# Patient Record
Sex: Female | Born: 1985 | Race: White | Hispanic: No | Marital: Married | Smoking: Never smoker
Health system: Southern US, Community
[De-identification: ages and names within clinical notes are randomized; demographics above are authoritative.]

## PROBLEM LIST (undated history)

## (undated) ENCOUNTER — Inpatient Hospital Stay (HOSPITAL_COMMUNITY): Payer: Self-pay

## (undated) DIAGNOSIS — M51369 Other intervertebral disc degeneration, lumbar region without mention of lumbar back pain or lower extremity pain: Secondary | ICD-10-CM

## (undated) DIAGNOSIS — R519 Headache, unspecified: Secondary | ICD-10-CM

## (undated) DIAGNOSIS — M5136 Other intervertebral disc degeneration, lumbar region: Secondary | ICD-10-CM

## (undated) DIAGNOSIS — R51 Headache: Secondary | ICD-10-CM

## (undated) DIAGNOSIS — T8859XA Other complications of anesthesia, initial encounter: Secondary | ICD-10-CM

## (undated) DIAGNOSIS — J45909 Unspecified asthma, uncomplicated: Secondary | ICD-10-CM

## (undated) DIAGNOSIS — T4145XA Adverse effect of unspecified anesthetic, initial encounter: Secondary | ICD-10-CM

## (undated) HISTORY — PX: BACK SURGERY: SHX140

## (undated) HISTORY — PX: SPINAL FUSION: SHX223

## (undated) HISTORY — PX: CHOLECYSTECTOMY: SHX55

## (undated) HISTORY — PX: BREAST LUMPECTOMY: SHX2

---
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
## 2004-06-12 ENCOUNTER — Ambulatory Visit: Payer: Self-pay | Admitting: Orthopedic Surgery

## 2004-06-25 ENCOUNTER — Emergency Department: Payer: Self-pay | Admitting: Emergency Medicine

## 2005-04-16 ENCOUNTER — Emergency Department: Payer: Self-pay | Admitting: Unknown Physician Specialty

## 2005-09-17 ENCOUNTER — Ambulatory Visit: Payer: Self-pay | Admitting: Unknown Physician Specialty

## 2005-10-29 ENCOUNTER — Ambulatory Visit: Payer: Self-pay | Admitting: Vascular Surgery

## 2006-01-07 ENCOUNTER — Emergency Department: Payer: Self-pay | Admitting: Emergency Medicine

## 2008-04-18 ENCOUNTER — Ambulatory Visit: Payer: Self-pay | Admitting: Orthopedic Surgery

## 2011-11-30 ENCOUNTER — Encounter (HOSPITAL_COMMUNITY): Payer: Self-pay | Admitting: *Deleted

## 2011-11-30 ENCOUNTER — Emergency Department (HOSPITAL_COMMUNITY)
Admission: EM | Admit: 2011-11-30 | Discharge: 2011-11-30 | Disposition: A | Discharge status: Home / Self Care | Payer: BC Managed Care – PPO | Source: Home / Self Care

## 2011-11-30 DIAGNOSIS — K5289 Other specified noninfective gastroenteritis and colitis: Secondary | ICD-10-CM

## 2011-11-30 DIAGNOSIS — K529 Noninfective gastroenteritis and colitis, unspecified: Secondary | ICD-10-CM

## 2011-11-30 MED ORDER — ONDANSETRON 4 MG PO TBDP
4.0000 mg | ORAL_TABLET | Freq: Once | ORAL | Status: AC
  Administered 2011-11-30: 4 mg via ORAL

## 2011-11-30 MED ORDER — ONDANSETRON HCL 4 MG PO TABS: 4.0000 mg | ORAL_TABLET | Freq: Four times a day (QID) | ORAL | Status: AC

## 2011-11-30 NOTE — ED Provider Notes (Signed)
History     CSN: [id number]  Arrival date & time 11/30/11  1541   None     Chief Complaint  Patient presents with  . Diarrhea  . Emesis    (Consider location/radiation/quality/duration/timing/severity/associated sxs/prior treatment) HPI Comments: Pt developed diarrhea 11/27/11, averaging an episode an hour.  Assoc with crampy abd pain.  Has been using imodium with no relief.  Did not alter diet and has been eating chicken sandwiches, ham, etc.  Vomiting started this morning.  None in 1.5 hours prior to this exam.    Patient is a 27 y.o. female presenting with diarrhea and vomiting. The history is provided by the patient.  Diarrhea The primary symptoms include abdominal pain, nausea, vomiting and diarrhea. Primary symptoms do not include fever, melena or hematemesis. The illness began 3 to 5 days ago. The onset was sudden. The problem has not changed since onset. The abdominal pain began more than 2 days ago. The abdominal pain has been unchanged since its onset. The abdominal pain is generalized. The severity of the abdominal pain is 4/10.  The illness does not include chills.  Emesis  This is a new problem. The current episode started 6 to 12 hours ago. The problem occurs 5 to 10 times per day. The problem has been gradually improving. The emesis has an appearance of stomach contents. There has been no fever. Associated symptoms include abdominal pain and diarrhea. Pertinent negatives include no chills, no cough and no fever.    History reviewed. No pertinent past medical history.  Past Surgical History  Procedure Date  . Spinal fusion     No family history on file.  History  Substance Use Topics  . Smoking status: Never Smoker   . Smokeless tobacco: Not on file  . Alcohol Use: Yes     Occasional    OB History    Grav Para Term Preterm Abortions TAB SAB Ect Mult Living                  Review of Systems  Constitutional: Positive for appetite change. Negative for  fever and chills.  Respiratory: Negative for cough.   Gastrointestinal: Positive for nausea, vomiting, abdominal pain and diarrhea. Negative for blood in stool, melena and hematemesis.    Allergies  Bentyl  Home Medications   Current Outpatient Rx  Name Route Sig Dispense Refill  . LOPERAMIDE HCL 2 MG PO CAPS Oral Take 2 mg by mouth 4 (four) times daily as needed.    . NORETHINDRONE 0.35 MG PO TABS Oral Take 1 tablet by mouth daily.    Marland Kitchen ONDANSETRON HCL 4 MG PO TABS Oral Take 1 tablet (4 mg total) by mouth every 6 (six) hours. 12 tablet 0    BP 110/72  Pulse 110  Temp(Src) 98.8 F (37.1 C) (Oral)  Resp 16  SpO2 99%  Physical Exam  Constitutional: She appears well-developed and well-nourished. No distress.  Cardiovascular: Normal rate and regular rhythm.   Pulmonary/Chest: Effort normal and breath sounds normal.  Abdominal: Soft. She exhibits no distension. Bowel sounds are increased. There is generalized tenderness. There is no rigidity, no rebound and no guarding.       Mild generalized abd pain on palpation.   Skin: She is not diaphoretic.    ED Course  Procedures (including critical care time)  Labs Reviewed - No data to display No results found.   1. Gastroenteritis       MDM  Tolerated oral fluids well  here in [facility], no vomiting here. Discussed diet strategies at length.        Cathlyn Parsons, NP 11/30/11 1754

## 2011-11-30 NOTE — Discharge Instructions (Signed)
Clear liquids ONLY for 24 hours after the last time you vomited.  Then, you can slowly add back solid foods, starting with bland, simple foods like saltines, plain toast/bread, plain white rice, plain pasta, etc.  If you tolerate that for another day, gradually add back your regular foods, leaving meat, dairy, spicy and greasy foods for last.    Clear Liquid Diet The clear liquid dietconsists of foods that are liquid or will become liquid at room temperature.You should be able to see through the liquid and beverages. Examples of foods allowed on a clear liquid diet include fruit juice, broth or bouillon, gelatin, or frozen ice pops. The purpose of this diet is to provide necessary fluid, electrolytes such as sodium and potassium, and energy to keep the body functioning during times when you are not able to consume a regular diet.A clear liquid diet should not be continued for long periods of time as it is not nutritionally adequate.  REASONS FOR USING A CLEAR LIQUID DIET  In sudden onset (acute) conditions for a patient before or after surgery.   As the first step in oral feeding.   For fluid and electrolyte replacement in diarrheal diseases.   As a diet before certain medical tests are performed.  ADEQUACY The clear liquid diet is adequate only in ascorbic acid, according to the Recommended Dietary Allowances of the [employer]. CHOOSING FOODS Breads and Starches  Allowed:  None are allowed.   Avoid: All are avoided.  Vegetables  Allowed:  Strained tomato or vegetable juice.   Avoid: Any others.  Fruit  Allowed:  Strained fruit juices and fruit drinks. Include 1 serving of citrus or vitamin C-enriched fruit juice daily.   Avoid: Any others.  Meat and Meat Substitutes  Allowed:  None are allowed.   Avoid: All are avoided.  Milk  Allowed:  None are allowed.   Avoid: All are avoided.  Soups and Combination Foods  Allowed:  Clear bouillon, broth, or strained  broth-based soups.   Avoid: Any others.  Desserts and Sweets  Allowed:  Sugar, honey. High protein gelatin. Flavored gelatin, ices, or frozen ice pops that do not contain milk.   Avoid: Any others.  Fats and Oils  Allowed:  None are allowed.   Avoid: All are avoided.  Beverages  Allowed: Cereal beverages, coffee (regular or decaffeinated), tea, or soda at the discretion of your caregiver.   Avoid: Any others.  Condiments  Allowed:  Iodized salt.   Avoid: Any others, including pepper.  Supplements  Allowed:  Liquid nutrition beverages.   Avoid: Any others that contain lactose or fiber.  SAMPLE MEAL PLAN Breakfast  4 oz (120 mL) strained orange juice.    to 1 cup (125 to 250 mL) gelatin (plain or fortified).   1 cup (250 mL) beverage (coffee or tea).   Sugar, if desired.  Midmorning Snack   cup (125 mL) gelatin (plain or fortified).  Lunch  1 cup (250 mL) broth or consomm.   4 oz (120 mL) strained grapefruit juice.    cup (125 mL) gelatin (plain or fortified).   1 cup (250 mL) beverage (coffee or tea).   Sugar, if desired.  Midafternoon Snack   cup (125 mL) fruit ice.    cup (125 mL) strained fruit juice.  Dinner  1 cup (250 mL) broth or consomm.    cup (125 mL) cranberry juice.    cup (125 mL) flavored gelatin (plain or fortified).   1 cup (  250 mL) beverage (coffee or tea).   Sugar, if desired.  Evening Snack  4 oz (120 mL) strained apple juice (vitamin C-fortified).    cup (125 mL) flavored gelatin (plain or fortified).  Document Released: 08/18/2005 Document Revised: 08/07/2011 Document Reviewed: 11/15/2010 [facility] Patient Information 2012 [place], [place].B.R.A.T. Diet Your doctor has recommended the B.R.A.T. diet for you or your child until the condition improves. This is often used to help control diarrhea and vomiting symptoms. If you or your child can tolerate clear liquids, you may have:  Bananas.   Rice.   Applesauce.     Toast (and other simple starches such as crackers, potatoes, noodles).  Be sure to avoid dairy products, meats, and fatty foods until symptoms are better. Fruit juices such as apple, grape, and prune juice can make diarrhea worse. Avoid these. Continue this diet for 2 days or as instructed by your caregiver. Document Released: 08/18/2005 Document Revised: 08/07/2011 Document Reviewed: 02/04/2007 [facility] Patient Information 2012 [place], [place].Viral Gastroenteritis Viral gastroenteritis is also known as stomach flu. This condition affects the stomach and intestinal tract. It can cause sudden diarrhea and vomiting. The illness typically lasts 3 to 8 days. Most people develop an immune response that eventually gets rid of the virus. While this natural response develops, the virus can make you quite ill. CAUSES  Many different viruses can cause gastroenteritis, such as rotavirus or noroviruses. You can catch one of these viruses by consuming contaminated food or water. You may also catch a virus by sharing utensils or other personal items with an infected person or by touching a contaminated surface. SYMPTOMS  The most common symptoms are diarrhea and vomiting. These problems can cause a severe loss of body fluids (dehydration) and a body salt (electrolyte) imbalance. Other symptoms may include:  Fever.   Headache.   Fatigue.   Abdominal pain.  DIAGNOSIS  Your caregiver can usually diagnose viral gastroenteritis based on your symptoms and a physical exam. A stool sample may also be taken to test for the presence of viruses or other infections. TREATMENT  This illness typically goes away on its own. Treatments are aimed at rehydration. The most serious cases of viral gastroenteritis involve vomiting so severely that you are not able to keep fluids down. In these cases, fluids must be given through an intravenous line (IV). HOME CARE INSTRUCTIONS   Drink enough fluids to keep your urine clear  or pale yellow. Drink small amounts of fluids frequently and increase the amounts as tolerated.   Ask your caregiver for specific rehydration instructions.   Avoid:   Foods high in sugar.   Alcohol.   Carbonated drinks.   Tobacco.   Juice.   Caffeine drinks.   Extremely hot or cold fluids.   Fatty, greasy foods.   Too much intake of anything at one time.   Dairy products until 24 to 48 hours after diarrhea stops.   You may consume probiotics. Probiotics are active cultures of beneficial bacteria. They may lessen the amount and number of diarrheal stools in adults. Probiotics can be found in yogurt with active cultures and in supplements.   Wash your hands well to avoid spreading the virus.   Only take over-the-counter or prescription medicines for pain, discomfort, or fever as directed by your caregiver. Do not give aspirin to children. Antidiarrheal medicines are not recommended.   Ask your caregiver if you should continue to take your regular prescribed and over-the-counter medicines.   Keep all follow-up appointments as  directed by your caregiver.  SEEK IMMEDIATE MEDICAL CARE IF:   You are unable to keep fluids down.   You do not urinate at least once every 6 to 8 hours.   You develop shortness of breath.   You notice blood in your stool or vomit. This may look like coffee grounds.   You have abdominal pain that increases or is concentrated in one small area (localized).   You have persistent vomiting or diarrhea.   You have a fever.   The patient is a child younger than 3 months, and he or she has a fever.   The patient is a child older than 3 months, and he or she has a fever and persistent symptoms.   The patient is a child older than 3 months, and he or she has a fever and symptoms suddenly get worse.   The patient is a baby, and he or she has no tears when crying.  MAKE SURE YOU:   Understand these instructions.   Will watch your condition.    Will get help right away if you are not doing well or get worse.  Document Released: 08/18/2005 Document Revised: 08/07/2011 Document Reviewed: 06/04/2011 [facility] Patient Information 2012 [place], [place].

## 2011-11-30 NOTE — ED Provider Notes (Signed)
Medical screening examination/treatment/procedure(s) were performed by non-physician practitioner and as supervising physician I was immediately available for consultation/collaboration.  Alaine Loughney   Wilbert Schouten, MD 11/30/11 1956 

## 2011-11-30 NOTE — ED Notes (Signed)
C/O watery diarrhea and generalized abd cramping onset Thurs without fevers.  Today starting vomiting.  Has been able to keep down small amounts of PO fluids.  Has been taking Pepto, Tums, and Imodium (states she has maxed out on the Imodium for the last 2 days).  Pt is currently breastfeeding.

## 2011-11-30 NOTE — ED Notes (Signed)
Sprite offered - instructed on small, frequent sips.  Pt verbalized understanding.

## 2013-10-06 ENCOUNTER — Encounter (HOSPITAL_COMMUNITY): Payer: Self-pay | Admitting: Emergency Medicine

## 2013-10-06 ENCOUNTER — Emergency Department (INDEPENDENT_AMBULATORY_CARE_PROVIDER_SITE_OTHER): Payer: 59

## 2013-10-06 ENCOUNTER — Emergency Department (HOSPITAL_COMMUNITY)
Admission: EM | Admit: 2013-10-06 | Discharge: 2013-10-06 | Disposition: A | Discharge status: Home / Self Care | Payer: 59 | Source: Home / Self Care | Attending: Emergency Medicine | Admitting: Emergency Medicine

## 2013-10-06 DIAGNOSIS — J40 Bronchitis, not specified as acute or chronic: Secondary | ICD-10-CM

## 2013-10-06 MED ORDER — SODIUM CHLORIDE 0.9 % IV BOLUS (SEPSIS)
1000.0000 mL | Freq: Once | INTRAVENOUS | Status: AC
  Administered 2013-10-06: 1000 mL via INTRAVENOUS

## 2013-10-06 MED ORDER — AZITHROMYCIN 250 MG PO TABS: ORAL_TABLET | ORAL | Status: DC

## 2013-10-06 MED ORDER — IPRATROPIUM-ALBUTEROL 0.5-2.5 (3) MG/3ML IN SOLN
3.0000 mL | Freq: Once | RESPIRATORY_TRACT | Status: AC
  Administered 2013-10-06: 3 mL via RESPIRATORY_TRACT

## 2013-10-06 MED ORDER — IPRATROPIUM-ALBUTEROL 0.5-2.5 (3) MG/3ML IN SOLN: 3.0000 mL | Freq: Once | RESPIRATORY_TRACT | Status: DC

## 2013-10-06 MED ORDER — HYDROCOD POLST-CHLORPHEN POLST 10-8 MG/5ML PO LQCR: 5.0000 mL | Freq: Two times a day (BID) | ORAL | Status: DC | PRN

## 2013-10-06 MED ORDER — PREDNISONE (PAK) 5 MG PO TABS: ORAL_TABLET | ORAL | Status: DC

## 2013-10-06 MED ORDER — IPRATROPIUM-ALBUTEROL 0.5-2.5 (3) MG/3ML IN SOLN
RESPIRATORY_TRACT | Status: AC
  Filled 2013-10-06: qty 3

## 2013-10-06 MED ORDER — DEXAMETHASONE SODIUM PHOSPHATE 10 MG/ML IJ SOLN
INTRAMUSCULAR | Status: AC
  Filled 2013-10-06: qty 1

## 2013-10-06 MED ORDER — DEXAMETHASONE SODIUM PHOSPHATE 10 MG/ML IJ SOLN
10.0000 mg | Freq: Once | INTRAMUSCULAR | Status: AC
  Administered 2013-10-06: 10 mg via INTRAMUSCULAR

## 2013-10-06 NOTE — ED Notes (Signed)
Pt     Reports     Symptoms      Of  bronchitis         With           Cough   And congestion  Body  Aches      Her  Symptoms  Began about 1  Week  Ago  She  Was  Seen  [facility]               2  Days  Ago                She  Is  Masked and is in a  Private  Room

## 2013-10-06 NOTE — Discharge Instructions (Signed)

## 2013-10-06 NOTE — ED Provider Notes (Signed)
CSN: [id number]     Arrival date & time 10/06/13  0811 History   None    Chief Complaint  Patient presents with  . URI   (Consider location/radiation/quality/duration/timing/severity/associated sxs/prior Treatment) HPI Comments: [age] presents presents complaining of being sick for one week. For one week, she has a persistent cough, congestion, runny nose. In the last couple of days, she started to also have some shortness of breath and chest pain with coughing. The pain is in the band around her lower ribs. She has been seen at the [facility] in the clinic twice for this. Was recently, she was prescribed numerous cough suppressants and an inhaler. This does seem to help slightly, but overall her symptoms are still getting markedly worse. When this began, she had a fever of 101 but this has since resolved. She also admits to some pressure in her ears and sore throat from coughing. She denies NVD, recent travel. She does have sick contacts.   History reviewed. No pertinent past medical history. Past Surgical History  Procedure Laterality Date  . Spinal fusion     History reviewed. No pertinent family history. History  Substance Use Topics  . Smoking status: Never Smoker   . Smokeless tobacco: Not on file  . Alcohol Use: Yes     Comment: Occasional   OB History   Grav Para Term Preterm Abortions TAB SAB Ect Mult Living                 Review of Systems  Constitutional: Positive for fever and fatigue. Negative for chills.  HENT: Positive for congestion, ear pain, postnasal drip, rhinorrhea, sinus pressure and sore throat.   Eyes: Negative for visual disturbance.  Respiratory: Positive for cough, chest tightness and shortness of breath.   Cardiovascular: Negative for chest pain, palpitations and leg swelling.  Gastrointestinal: Negative for nausea, vomiting and abdominal pain.  Endocrine: Negative for polydipsia and polyuria.  Genitourinary: Negative for dysuria, urgency and frequency.   Musculoskeletal: Positive for myalgias. Negative for arthralgias.  Skin: Negative for rash.  Neurological: Negative for dizziness, weakness and light-headedness.    Allergies  Dicyclomine hcl  Home Medications   Current Outpatient Rx  Name  Route  Sig  Dispense  Refill  . albuterol (PROVENTIL HFA;VENTOLIN HFA) 108 (90 BASE) MCG/ACT inhaler   Inhalation   Inhale into the lungs every 6 (six) hours as needed for wheezing or shortness of breath.         . BENZONATATE PO   Oral   Take by mouth.         . fluticasone (FLONASE) 50 MCG/ACT nasal spray   Each Nare   Place into both nostrils daily.         Marland Kitchen. guaiFENesin-codeine (GUIATUSS AC) 100-10 MG/5ML syrup   Oral   Take 5 mLs by mouth 3 (three) times daily as needed for cough.         Marland Kitchen. azithromycin (ZITHROMAX Z-PAK) 250 MG tablet      Use as directed   6 each   0   . chlorpheniramine-HYDROcodone (TUSSIONEX PENNKINETIC ER) 10-8 MG/5ML LQCR   Oral   Take 5 mLs by mouth every 12 (twelve) hours as needed for cough.   115 mL   0   . loperamide (IMODIUM) 2 MG capsule   Oral   Take 2 mg by mouth 4 (four) times daily as needed.         . norethindrone (MICRONOR,CAMILA,ERRIN) 0.35 MG tablet  Oral   Take 1 tablet by mouth daily.         . predniSONE (STERAPRED UNI-PAK) 5 MG TABS tablet      Use as directed on package   1 each   0    BP 120/74  Pulse 78  Temp(Src) 98.6 F (37 C) (Oral)  Resp 20  SpO2 98%  LMP 10/02/2013 Physical Exam  Nursing note and vitals reviewed. Constitutional: She is oriented to person, place, and time. Vital signs are normal. She appears well-developed and well-nourished. She appears ill. No distress.  HENT:  Head: Normocephalic and atraumatic.  Right Ear: Tympanic membrane, external ear and ear canal normal.  Left Ear: Tympanic membrane, external ear and ear canal normal.  Nose: Nose normal. Right sinus exhibits no maxillary sinus tenderness and no frontal sinus  tenderness. Left sinus exhibits no maxillary sinus tenderness and no frontal sinus tenderness.  Mouth/Throat: Uvula is midline, oropharynx is clear and moist and mucous membranes are normal.  Cardiovascular: Regular rhythm, normal heart sounds and normal pulses.  Tachycardia present.  Exam reveals no gallop.   No murmur heard. HR 108 manual   Pulmonary/Chest: Effort normal. No respiratory distress. She has wheezes (scattered). She has no rhonchi. She has no rales. She exhibits no mass.  Abdominal: Soft. There is no tenderness.  Lymphadenopathy:       Head (right side): Submandibular and tonsillar adenopathy present.       Head (left side): Submandibular and tonsillar adenopathy present.  Neurological: She is alert and oriented to person, place, and time. She has normal strength. Coordination normal.  Skin: Skin is warm and dry. No rash noted. She is not diaphoretic.  Psychiatric: She has a normal mood and affect. Judgment normal.    ED Course  Procedures (including critical care time) Labs Review Labs Reviewed - No data to display Imaging Review Dg Chest 2 View  10/06/2013   CLINICAL DATA:  Sick for 1 week, cough, head and chest congestion, fever, shortness of breath, wheezing, dizziness  EXAM: CHEST  2 VIEW  COMPARISON:  None  FINDINGS: Normal heart size, mediastinal contours, and pulmonary vascularity.  Peribronchial thickening without infiltrate, pleural effusion or pneumothorax.  Bones unremarkable.  IMPRESSION: Mild bronchitic changes.   Electronically Signed   By: Ulyses Southward M.D.   On: 10/06/2013 08:58    930: Pt became slightly dizzy during XR.  Given duoneb upon returning and allowed to sit for a few minutes.  Following this, orthostatics revealed significant increase in pulse without drop in BP, will give 1 L bolus 0.9% NaCl and reassess.    1054: after 1 L, she is feeling slightly better. Pulse is 85 with blood pressure 120/80, oxygen saturation is 100%. She is requesting  discharge to home and rest  MDM   1. Bronchitis    Will discharge with the caveat that she return to the emergency department if worsening. Given her worsening course over one week, will treat with antibiotics, steroids, cough suppressant, she will continue Ventolin    Meds ordered this encounter  Medications  . ipratropium-albuterol (DUONEB) 0.5-2.5 (3) MG/3ML nebulizer solution 3 mL    Sig:   . dexamethasone (DECADRON) injection 10 mg    Sig:   . sodium chloride 0.9 % bolus 1,000 mL    Sig:   . azithromycin (ZITHROMAX Z-PAK) 250 MG tablet    Sig: Use as directed    Dispense:  6 each    Refill:  0  .  predniSONE (STERAPRED UNI-PAK) 5 MG TABS tablet    Sig: Use as directed on package    Dispense:  1 each    Refill:  0  . chlorpheniramine-HYDROcodone (TUSSIONEX PENNKINETIC ER) 10-8 MG/5ML LQCR    Sig: Take 5 mLs by mouth every 12 (twelve) hours as needed for cough.    Dispense:  115 mL    Refill:  0      Graylon Good, PA-C 10/06/13 1059

## 2013-10-06 NOTE — ED Notes (Signed)
bp   118/81     Laying  Pulse   90  bp  116/81     Sitting Pulse  113   bp  118/86       Standing    123

## 2013-10-06 NOTE — ED Provider Notes (Signed)
Medical screening examination/treatment/procedure(s) were performed by non-physician practitioner and as supervising physician I was immediately available for consultation/collaboration.  Tanmay Halteman, M.D.   Raylee Adamec C Rhonda Linan, MD 10/06/13 1904 

## 2014-08-16 ENCOUNTER — Encounter (HOSPITAL_COMMUNITY): Payer: Self-pay | Admitting: Emergency Medicine

## 2014-08-16 ENCOUNTER — Emergency Department (HOSPITAL_COMMUNITY)
Admission: EM | Admit: 2014-08-16 | Discharge: 2014-08-17 | Disposition: A | Discharge status: Home / Self Care | Payer: 59 | Attending: Emergency Medicine | Admitting: Emergency Medicine

## 2014-08-16 DIAGNOSIS — Z79899 Other long term (current) drug therapy: Secondary | ICD-10-CM | POA: Insufficient documentation

## 2014-08-16 DIAGNOSIS — R110 Nausea: Secondary | ICD-10-CM | POA: Insufficient documentation

## 2014-08-16 DIAGNOSIS — Z3202 Encounter for pregnancy test, result negative: Secondary | ICD-10-CM | POA: Insufficient documentation

## 2014-08-16 DIAGNOSIS — R109 Unspecified abdominal pain: Secondary | ICD-10-CM

## 2014-08-16 DIAGNOSIS — R1010 Upper abdominal pain, unspecified: Secondary | ICD-10-CM | POA: Diagnosis not present

## 2014-08-16 DIAGNOSIS — Z7951 Long term (current) use of inhaled steroids: Secondary | ICD-10-CM | POA: Insufficient documentation

## 2014-08-16 DIAGNOSIS — Z9049 Acquired absence of other specified parts of digestive tract: Secondary | ICD-10-CM | POA: Diagnosis not present

## 2014-08-16 NOTE — ED Notes (Signed)
Pt. reports right flank/right lateral abdominal pain onset this evening with nausea , denies hematuria or dysuria , no fever or chills.

## 2014-08-17 ENCOUNTER — Emergency Department (HOSPITAL_COMMUNITY): Payer: 59

## 2014-08-17 LAB — COMPREHENSIVE METABOLIC PANEL
ALBUMIN: 4 g/dL (ref 3.5–5.2)
ALK PHOS: 122 U/L — AB (ref 39–117)
ALT: 34 U/L (ref 0–35)
ANION GAP: 14 (ref 5–15)
AST: 28 U/L (ref 0–37)
BILIRUBIN TOTAL: 0.4 mg/dL (ref 0.3–1.2)
BUN: 13 mg/dL (ref 6–23)
CHLORIDE: 103 meq/L (ref 96–112)
CO2: 24 mEq/L (ref 19–32)
CREATININE: 0.77 mg/dL (ref 0.50–1.10)
Calcium: 9.2 mg/dL (ref 8.4–10.5)
GLUCOSE: 107 mg/dL — AB (ref 70–99)
POTASSIUM: 4 meq/L (ref 3.7–5.3)
Sodium: 141 mEq/L (ref 137–147)
Total Protein: 7.7 g/dL (ref 6.0–8.3)

## 2014-08-17 LAB — URINALYSIS, ROUTINE W REFLEX MICROSCOPIC
BILIRUBIN URINE: NEGATIVE
Glucose, UA: NEGATIVE mg/dL
HGB URINE DIPSTICK: NEGATIVE
KETONES UR: NEGATIVE mg/dL
NITRITE: NEGATIVE
PROTEIN: NEGATIVE mg/dL
Specific Gravity, Urine: 1.025 (ref 1.005–1.030)
UROBILINOGEN UA: 0.2 mg/dL (ref 0.0–1.0)
pH: 6 (ref 5.0–8.0)

## 2014-08-17 LAB — CBC WITH DIFFERENTIAL/PLATELET
BASOS PCT: 1 % (ref 0–1)
Basophils Absolute: 0.1 10*3/uL (ref 0.0–0.1)
Eosinophils Absolute: 0.3 10*3/uL (ref 0.0–0.7)
Eosinophils Relative: 3 % (ref 0–5)
HEMATOCRIT: 39 % (ref 36.0–46.0)
HEMOGLOBIN: 13.6 g/dL (ref 12.0–15.0)
LYMPHS ABS: 3.1 10*3/uL (ref 0.7–4.0)
Lymphocytes Relative: 37 % (ref 12–46)
MCH: 31 pg (ref 26.0–34.0)
MCHC: 34.9 g/dL (ref 30.0–36.0)
MCV: 88.8 fL (ref 78.0–100.0)
MONO ABS: 0.7 10*3/uL (ref 0.1–1.0)
MONOS PCT: 9 % (ref 3–12)
NEUTROS ABS: 4.3 10*3/uL (ref 1.7–7.7)
Neutrophils Relative %: 50 % (ref 43–77)
Platelets: 279 10*3/uL (ref 150–400)
RBC: 4.39 MIL/uL (ref 3.87–5.11)
RDW: 12.2 % (ref 11.5–15.5)
WBC: 8.5 10*3/uL (ref 4.0–10.5)

## 2014-08-17 LAB — PREGNANCY, URINE: PREG TEST UR: NEGATIVE

## 2014-08-17 LAB — URINE MICROSCOPIC-ADD ON

## 2014-08-17 MED ORDER — HYDROCODONE-ACETAMINOPHEN 5-325 MG PO TABS: 1.0000 | ORAL_TABLET | ORAL | Status: DC | PRN

## 2014-08-17 MED ORDER — ONDANSETRON HCL 4 MG/2ML IJ SOLN
4.0000 mg | Freq: Once | INTRAMUSCULAR | Status: AC
Start: 2014-08-17 — End: 2014-08-17
  Administered 2014-08-17: 4 mg via INTRAVENOUS
  Filled 2014-08-17: qty 2

## 2014-08-17 MED ORDER — POLYETHYLENE GLYCOL 3350 17 G PO PACK: 17.0000 g | PACK | Freq: Every day | ORAL | Status: DC

## 2014-08-17 MED ORDER — ONDANSETRON HCL 4 MG PO TABS: 4.0000 mg | ORAL_TABLET | Freq: Once | ORAL | Status: DC

## 2014-08-17 MED ORDER — HYDROMORPHONE HCL 1 MG/ML IJ SOLN
0.5000 mg | Freq: Once | INTRAMUSCULAR | Status: AC
  Administered 2014-08-17: 0.5 mg via INTRAVENOUS
  Filled 2014-08-17: qty 1

## 2014-08-17 NOTE — ED Notes (Signed)
Pt escorted to bathroom 

## 2014-08-17 NOTE — ED Notes (Signed)
EDP at bedside  

## 2014-08-17 NOTE — ED Provider Notes (Signed)
CSN: [id number]     Arrival date & time 08/16/14  2333 History   First MD Initiated Contact with Patient 08/16/14 2340     Chief Complaint  Patient presents with  . Flank Pain     (Consider location/radiation/quality/duration/timing/severity/associated sxs/prior Treatment) Patient is a 28 y.o. female presenting with flank pain. The history is provided by the patient. No language interpreter was used.  Flank Pain This is a new problem. Pertinent negatives include no chills, fever or nausea. Associated symptoms comments: She had sudden onset right lateral upper abdominal wall pain earlier this evening. No SOB but pain is increased with deep breathing. No fever or cough. She has nausea without vomiting. She reports cholecystectomy 1.5 years ago. Marland Kitchen    History reviewed. No pertinent past medical history. Past Surgical History  Procedure Laterality Date  . Spinal fusion    . Cholecystectomy    . Breast lumpectomy     No family history on file. History  Substance Use Topics  . Smoking status: Never Smoker   . Smokeless tobacco: Not on file  . Alcohol Use: Yes     Comment: Occasional   OB History    No data available     Review of Systems  Constitutional: Negative for fever and chills.  Respiratory: Negative.        See HPI.  Cardiovascular: Negative.   Gastrointestinal: Negative for nausea.       See HPI.  Genitourinary: Positive for flank pain. Negative for dysuria and hematuria.  Musculoskeletal: Negative.   Skin: Negative.   Neurological: Negative.       Allergies  Dicyclomine hcl  Home Medications   Prior to Admission medications   Medication Sig Start Date End Date Taking? Authorizing Provider  albuterol (PROVENTIL HFA;VENTOLIN HFA) 108 (90 BASE) MCG/ACT inhaler Inhale into the lungs every 6 (six) hours as needed for wheezing or shortness of breath.    Historical Provider, MD  azithromycin (ZITHROMAX Z-PAK) 250 MG tablet Use as directed 10/06/13   Graylon Good, PA-C  BENZONATATE PO Take by mouth.    Historical Provider, MD  chlorpheniramine-HYDROcodone (TUSSIONEX PENNKINETIC ER) 10-8 MG/5ML LQCR Take 5 mLs by mouth every 12 (twelve) hours as needed for cough. 10/06/13   Graylon Good, PA-C  fluticasone (FLONASE) 50 MCG/ACT nasal spray Place into both nostrils daily.    Historical Provider, MD  guaiFENesin-codeine (GUIATUSS AC) 100-10 MG/5ML syrup Take 5 mLs by mouth 3 (three) times daily as needed for cough.    Historical Provider, MD  loperamide (IMODIUM) 2 MG capsule Take 2 mg by mouth 4 (four) times daily as needed.    Historical Provider, MD  norethindrone (MICRONOR,CAMILA,ERRIN) 0.35 MG tablet Take 1 tablet by mouth daily.    Historical Provider, MD  predniSONE (STERAPRED UNI-PAK) 5 MG TABS tablet Use as directed on package 10/06/13   Graylon Good, PA-C   BP 124/78 mmHg  Pulse 92  Temp(Src) 97.5 F (36.4 C) (Oral)  Resp 16  SpO2 100%  LMP  Physical Exam  Constitutional: She is oriented to person, place, and time. She appears well-developed and well-nourished.  HENT:  Head: Normocephalic.  Neck: Normal range of motion. Neck supple.  Cardiovascular: Normal rate and regular rhythm.   Pulmonary/Chest: Effort normal and breath sounds normal.  Abdominal: Soft. Bowel sounds are normal. There is no tenderness. There is no rebound and no guarding.    No RUQ tenderness. Tenderness is lateral upper abdominal area.   Genitourinary:  No CVA tenderness.   Musculoskeletal: Normal range of motion.  Neurological: She is alert and oriented to person, place, and time.  Skin: Skin is warm and dry. No rash noted.  Psychiatric: She has a normal mood and affect.    ED Course  Procedures (including critical care time) Labs Review Labs Reviewed  CBC WITH DIFFERENTIAL  COMPREHENSIVE METABOLIC PANEL  PREGNANCY, URINE  URINALYSIS, ROUTINE W REFLEX MICROSCOPIC   Results for orders placed or performed during the hospital encounter of 08/16/14    CBC with Differential  Result Value Ref Range   WBC 8.5 4.0 - 10.5 K/uL   RBC 4.39 3.87 - 5.11 MIL/uL   Hemoglobin 13.6 12.0 - 15.0 g/dL   HCT 16.139.0 09.636.0 - 04.546.0 %   MCV 88.8 78.0 - 100.0 fL   MCH 31.0 26.0 - 34.0 pg   MCHC 34.9 30.0 - 36.0 g/dL   RDW 40.912.2 81.111.5 - 91.415.5 %   Platelets 279 150 - 400 K/uL   Neutrophils Relative % 50 43 - 77 %   Neutro Abs 4.3 1.7 - 7.7 K/uL   Lymphocytes Relative 37 12 - 46 %   Lymphs Abs 3.1 0.7 - 4.0 K/uL   Monocytes Relative 9 3 - 12 %   Monocytes Absolute 0.7 0.1 - 1.0 K/uL   Eosinophils Relative 3 0 - 5 %   Eosinophils Absolute 0.3 0.0 - 0.7 K/uL   Basophils Relative 1 0 - 1 %   Basophils Absolute 0.1 0.0 - 0.1 K/uL  Comprehensive metabolic panel  Result Value Ref Range   Sodium 141 137 - 147 mEq/L   Potassium 4.0 3.7 - 5.3 mEq/L   Chloride 103 96 - 112 mEq/L   CO2 24 19 - 32 mEq/L   Glucose, Bld 107 (H) 70 - 99 mg/dL   BUN 13 6 - 23 mg/dL   Creatinine, Ser 7.820.77 0.50 - 1.10 mg/dL   Calcium 9.2 8.4 - 95.610.5 mg/dL   Total Protein 7.7 6.0 - 8.3 g/dL   Albumin 4.0 3.5 - 5.2 g/dL   AST 28 0 - 37 U/L   ALT 34 0 - 35 U/L   Alkaline Phosphatase 122 (H) 39 - 117 U/L   Total Bilirubin 0.4 0.3 - 1.2 mg/dL   GFR calc non Af Amer >90 >90 mL/min   GFR calc Af Amer >90 >90 mL/min   Anion gap 14 5 - 15  Pregnancy, urine  Result Value Ref Range   Preg Test, Ur NEGATIVE NEGATIVE  Urinalysis, Routine w reflex microscopic  Result Value Ref Range   Color, Urine YELLOW YELLOW   APPearance CLEAR CLEAR   Specific Gravity, Urine 1.025 1.005 - 1.030   pH 6.0 5.0 - 8.0   Glucose, UA NEGATIVE NEGATIVE mg/dL   Hgb urine dipstick NEGATIVE NEGATIVE   Bilirubin Urine NEGATIVE NEGATIVE   Ketones, ur NEGATIVE NEGATIVE mg/dL   Protein, ur NEGATIVE NEGATIVE mg/dL   Urobilinogen, UA 0.2 0.0 - 1.0 mg/dL   Nitrite NEGATIVE NEGATIVE   Leukocytes, UA TRACE (A) NEGATIVE  Urine microscopic-add on  Result Value Ref Range   Squamous Epithelial / LPF RARE RARE   WBC, UA  3-6 <3 WBC/hpf   RBC / HPF 0-2 <3 RBC/hpf   Bacteria, UA RARE RARE   Sperm, UA PRESENT    Urine-Other MUCOUS PRESENT    Dg Abd Acute W/chest  08/17/2014   CLINICAL DATA:  Initial evaluation for right-sided flank pain with nausea.  EXAM: ACUTE ABDOMEN  SERIES (ABDOMEN 2 VIEW & CHEST 1 VIEW)  COMPARISON:  Prior study from 10/06/2013  FINDINGS: Cardiac and mediastinal silhouettes are within normal limits.  Lungs are normally inflated. No focal infiltrate, pulmonary edema, or pleural effusion. No pneumothorax.  Bowel gas pattern is nonobstructive. No abnormal bowel wall thickening. Moderate to large amount of retained stool within the right colon. No free air. No soft tissue mass or abnormal calcification.  Spinal fusion present at the lumbosacral junction. No acute osseus abnormality.  IMPRESSION: 1. Nonobstructive bowel gas pattern. 2. Moderate to large amount of retained stool within the ascending colon, suggesting constipation. 3. No acute cardiopulmonary abnormality.   Electronically Signed   By: Rise MuBenjamin  McClintock M.D.   On: 08/17/2014 01:31    Imaging Review No results found.   EKG Interpretation None      MDM   Final diagnoses:  Flank pain    The patient is well appearing with reassuring labs/imaging. Constipation seen on abdominal film - recommended Miralax. Question correlation with pain of complaint. She is stable for discharge with return precautions discussed.     Arnoldo HookerShari A Steele Ledonne, PA-C 08/17/14 [id number]  Derwood KaplanAnkit Nanavati, MD 08/17/14 [phone]

## 2014-08-17 NOTE — Discharge Instructions (Signed)
Flank Pain °Flank pain refers to pain that is located on the side of the body between the upper abdomen and the back. The pain may occur over a short period of time (acute) or may be long-term or reoccurring (chronic). It may be mild or severe. Flank pain can be caused by many things. °CAUSES  °Some of the more common causes of flank pain include: °· Muscle strains.   °· Muscle spasms.   °· A disease of your spine (vertebral disk disease).   °· A lung infection (pneumonia).   °· Fluid around your lungs (pulmonary edema).   °· A kidney infection.   °· Kidney stones.   °· A very painful skin rash caused by the chickenpox virus (shingles).   °· Gallbladder disease.   °HOME CARE INSTRUCTIONS  °Home care will depend on the cause of your pain. In general, °· Rest as directed by your caregiver. °· Drink enough fluids to keep your urine clear or pale yellow. °· Only take over-the-counter or prescription medicines as directed by your caregiver. Some medicines may help relieve the pain. °· Tell your caregiver about any changes in your pain. °· Follow up with your caregiver as directed. °SEEK IMMEDIATE MEDICAL CARE IF:  °· Your pain is not controlled with medicine.   °· You have new or worsening symptoms. °· Your pain increases.   °· You have abdominal pain.   °· You have shortness of breath.   °· You have persistent nausea or vomiting.   °· You have swelling in your abdomen.   °· You feel faint or pass out.   °· You have blood in your urine. °· You have a fever or persistent symptoms for more than 2-3 days. °· You have a fever and your symptoms suddenly get worse. °MAKE SURE YOU:  °· Understand these instructions. °· Will watch your condition. °· Will get help right away if you are not doing well or get worse. °Document Released: 10/09/2005 Document Revised: 05/12/2012 Document Reviewed: 04/01/2012 °[employer]® Patient Information ©2015 [employer]. This information is not intended to replace advice given to you by your  health care provider. Make sure you discuss any questions you have with your health care provider. ° °

## 2014-08-24 ENCOUNTER — Other Ambulatory Visit: Payer: Self-pay | Admitting: Obstetrics & Gynecology

## 2014-08-24 ENCOUNTER — Other Ambulatory Visit (HOSPITAL_COMMUNITY)
Admission: RE | Admit: 2014-08-24 | Discharge: 2014-08-24 | Disposition: A | Discharge status: Home / Self Care | Payer: 59 | Source: Ambulatory Visit | Attending: Obstetrics & Gynecology | Admitting: Obstetrics & Gynecology

## 2014-08-24 DIAGNOSIS — Z01419 Encounter for gynecological examination (general) (routine) without abnormal findings: Secondary | ICD-10-CM | POA: Diagnosis not present

## 2014-08-29 LAB — CYTOLOGY - PAP

## 2015-04-25 ENCOUNTER — Emergency Department (HOSPITAL_COMMUNITY): Payer: 59

## 2015-04-25 ENCOUNTER — Emergency Department (HOSPITAL_COMMUNITY)
Admission: EM | Admit: 2015-04-25 | Discharge: 2015-04-25 | Disposition: A | Discharge status: Home / Self Care | Payer: 59 | Attending: Emergency Medicine | Admitting: Emergency Medicine

## 2015-04-25 ENCOUNTER — Encounter (HOSPITAL_COMMUNITY): Payer: Self-pay | Admitting: Emergency Medicine

## 2015-04-25 DIAGNOSIS — Y9389 Activity, other specified: Secondary | ICD-10-CM | POA: Insufficient documentation

## 2015-04-25 DIAGNOSIS — Z79899 Other long term (current) drug therapy: Secondary | ICD-10-CM | POA: Diagnosis not present

## 2015-04-25 DIAGNOSIS — S61211A Laceration without foreign body of left index finger without damage to nail, initial encounter: Secondary | ICD-10-CM | POA: Diagnosis not present

## 2015-04-25 DIAGNOSIS — IMO0002 Reserved for concepts with insufficient information to code with codable children: Secondary | ICD-10-CM

## 2015-04-25 DIAGNOSIS — Y9289 Other specified places as the place of occurrence of the external cause: Secondary | ICD-10-CM | POA: Diagnosis not present

## 2015-04-25 DIAGNOSIS — Y998 Other external cause status: Secondary | ICD-10-CM | POA: Diagnosis not present

## 2015-04-25 DIAGNOSIS — Y288XXA Contact with other sharp object, undetermined intent, initial encounter: Secondary | ICD-10-CM | POA: Diagnosis not present

## 2015-04-25 MED ORDER — HYDROCODONE-ACETAMINOPHEN 5-325 MG PO TABS
1.0000 | ORAL_TABLET | Freq: Once | ORAL | Status: AC
  Administered 2015-04-25: 1 via ORAL
  Filled 2015-04-25: qty 1

## 2015-04-25 MED ORDER — LIDOCAINE HCL (PF) 1 % IJ SOLN
5.0000 mL | Freq: Once | INTRAMUSCULAR | Status: AC
  Administered 2015-04-25: 5 mL
  Filled 2015-04-25: qty 5

## 2015-04-25 MED ORDER — ONDANSETRON 4 MG PO TBDP
4.0000 mg | ORAL_TABLET | Freq: Once | ORAL | Status: AC
  Administered 2015-04-25: 4 mg via ORAL
  Filled 2015-04-25: qty 1

## 2015-04-25 NOTE — ED Provider Notes (Signed)
CSN: [id number]     Arrival date & time 04/25/15  1927 History  This chart was scribed for non-physician practitioner, Everlene Farrier, PA-C working with Samuel Jester, DO by Gwenyth Ober, ED scribe. This patient was seen in room TR03C/TR03C and the patient's care was started at 8:45 PM   Chief Complaint  Patient presents with  . Laceration   The history is provided by the patient. No language interpreter was used.    HPI Comments: Maureen Nelson is a 29 y.o. right hand dominant female who presents to the Emergency Department complaining of a laceration, with controlled bleeding, to her left index finger that occurred PTA. She was cut by a hedge trimmer.  She states nausea as an associated symptom. Pt has not tried any treatment PTA. Her last tetanus was in 2012. Pt denies numbness and tingling as associated symptoms.   No past medical history on file. Past Surgical History  Procedure Laterality Date  . Spinal fusion    . Cholecystectomy    . Breast lumpectomy    . Back surgery     No family history on file. Social History  Substance Use Topics  . Smoking status: Never Smoker   . Smokeless tobacco: Not on file  . Alcohol Use: Yes     Comment: Occasional   OB History    No data available     Review of Systems  Constitutional: Negative for fever.  Gastrointestinal: Positive for nausea.  Skin: Positive for wound.  Neurological: Negative for weakness and numbness.      Allergies  Dicyclomine hcl  Home Medications   Prior to Admission medications   Medication Sig Start Date End Date Taking? Authorizing Provider  azithromycin (ZITHROMAX Z-PAK) 250 MG tablet Use as directed 10/06/13   Graylon Good, PA-C  chlorpheniramine-HYDROcodone (TUSSIONEX PENNKINETIC ER) 10-8 MG/5ML LQCR Take 5 mLs by mouth every 12 (twelve) hours as needed for cough. 10/06/13   Graylon Good, PA-C  HYDROcodone-acetaminophen (NORCO/VICODIN) 5-325 MG per tablet Take 1-2 tablets by mouth every 4  (four) hours as needed. 08/17/14   Elpidio Anis, PA-C  loperamide (IMODIUM) 2 MG capsule Take 2 mg by mouth 4 (four) times daily as needed.    Historical Provider, MD  norethindrone (MICRONOR,CAMILA,ERRIN) 0.35 MG tablet Take 1 tablet by mouth daily.    Historical Provider, MD  polyethylene glycol (MIRALAX) packet Take 17 g by mouth daily. 08/17/14   Elpidio Anis, PA-C  predniSONE (STERAPRED UNI-PAK) 5 MG TABS tablet Use as directed on package 10/06/13   Graylon Good, PA-C   BP 117/52 mmHg  Pulse 94  Temp(Src) 98.7 F (37.1 C)  Resp 19  Ht  (1.702 m)  Wt 215 lb (97.523 kg)  BMI 33.67 kg/m2  SpO2 99%  LMP 04/25/2015 Physical Exam  Constitutional: She appears well-developed and well-nourished. No distress.  Nontoxic appearing.  HENT:  Head: Normocephalic and atraumatic.  Eyes: Right eye exhibits no discharge. Left eye exhibits no discharge.  Cardiovascular: Normal rate and intact distal pulses.   Pulses:      Radial pulses are 2+ on the left side.  Pulmonary/Chest: Effort normal. No respiratory distress.  Musculoskeletal:  Left index finger: Good capillary refill. Full ROM. Sensation intact. Good strength all joints.  Neurological: She is alert. Coordination normal.  Skin: No rash noted. She is not diaphoretic.  1 cm irregular laceration to the volar aspect of her left index finger.   Psychiatric: She has a normal mood and  affect. Her behavior is normal.  Nursing note and vitals reviewed.   ED Course  LACERATION REPAIR Date/Time: 04/25/2015 9:30 PM Performed by: Everlene Farrier Authorized by: Everlene Farrier Consent: Verbal consent obtained. Risks and benefits: risks, benefits and alternatives were discussed Consent given by: patient Patient understanding: patient states understanding of the procedure being performed Patient consent: the patient's understanding of the procedure matches consent given Procedure consent: procedure consent matches procedure  scheduled Relevant documents: relevant documents present and verified Test results: test results available and properly labeled Site marked: the operative site was marked Imaging studies: imaging studies available Required items: required blood products, implants, devices, and special equipment available Patient identity confirmed: verbally with patient Time out: Immediately prior to procedure a "time out" was called to verify the correct patient, procedure, equipment, support staff and site/side marked as required. Body area: upper extremity Location details: left index finger Laceration length: 1 cm Foreign bodies: no foreign bodies Tendon involvement: none Nerve involvement: none Vascular damage: no Anesthesia: digital block Local anesthetic: lidocaine 1% without epinephrine Anesthetic total: 2 ml Patient sedated: no Preparation: Patient was prepped and draped in the usual sterile fashion. Irrigation solution: saline Irrigation method: jet lavage Amount of cleaning: extensive Debridement: none Degree of undermining: none Skin closure: 5-0 Prolene Number of sutures: 3 Technique: simple Approximation: loose Approximation difficulty: complex Dressing: non-adhesive packing strip Patient tolerance: Patient tolerated the procedure well with no immediate complications     DIAGNOSTIC STUDIES: Oxygen Saturation is 99% on RA, normal by my interpretation.    COORDINATION OF CARE: 8:51 PM Discussed treatment plan with pt which includes Zofran and laceration repair. Pt agreed to plan.   Labs Review Labs Reviewed - No data to display  Imaging Review Dg Finger Index Left  04/25/2015   CLINICAL DATA:  Laceration of the left index finger.  EXAM: LEFT INDEX FINGER 2+V  COMPARISON:  None.  FINDINGS: There is no evidence of fracture or dislocation. There is no evidence of arthropathy or other focal bone abnormality. Soft tissues are unremarkable.  IMPRESSION: Negative.   Electronically  Signed   By: Elige Ko   On: 04/25/2015 20:27     EKG Interpretation None      Filed Vitals:   04/25/15 1952  BP: 117/52  Pulse: 94  Temp: 98.7 F (37.1 C)  Resp: 19  Height: 5\' 7"  (1.702 m)  Weight: 215 lb (97.523 kg)  SpO2: 99%     MDM   Meds given in ED:  Medications  ondansetron (ZOFRAN-ODT) disintegrating tablet 4 mg (4 mg Oral Given 04/25/15 2054)  HYDROcodone-acetaminophen (NORCO/VICODIN) 5-325 MG per tablet 1 tablet (1 tablet Oral Given 04/25/15 2054)  lidocaine (PF) (XYLOCAINE) 1 % injection 5 mL (5 mLs Infiltration Given 04/25/15 2054)    New Prescriptions   No medications on file    Final diagnoses:  Laceration   This is a 29 year old right hand dominant female who presents to the emergency department complaining of a laceration to her left index finger. She reports she was cut by a Counsellor. Patient reports her last Tdap was in 2012. On exam the patient is afebrile nontoxic appearing. She has good sensation in her left distal fingertips. She has a 1 cm irregular laceration to the volar aspect of her left index finger. X-ray is unremarkable. No evidence of tendon or vascular involvement. Patient has good strength and sensation of her left distal fingertip. Bleeding is controlled. Laceration was repaired by me and tolerated well by  the patient. Three 5-0 proline sutures placed. Looser approximation due to partial superficial skin avulsion. Wound care instructions provided. I advised the patient follow up in 7 days to have her sutures removed. I advised the patient to follow-up with their primary care provider this week. I advised the patient to return to the emergency department with new or worsening symptoms or new concerns. The patient verbalized understanding and agreement with plan.    I personally performed the services described in this documentation, which was scribed in my presence. The recorded information has been reviewed and is accurate.       Everlene Farrier, PA-C 04/25/15 2148  Samuel Jester, DO 04/27/15 1357

## 2015-04-25 NOTE — Discharge Instructions (Signed)

## 2015-04-25 NOTE — ED Notes (Addendum)
Pt reports she was trimming hedges and it slipped and cut her left pointer finger. Small 3mm lac, bleeding controlled. [place] TDAP 2012.

## 2015-09-02 ENCOUNTER — Encounter (HOSPITAL_COMMUNITY): Payer: Self-pay | Admitting: *Deleted

## 2015-09-02 ENCOUNTER — Inpatient Hospital Stay (HOSPITAL_COMMUNITY)
Admission: EM | Admit: 2015-09-02 | Discharge: 2015-09-02 | Disposition: A | Discharge status: Home / Self Care | Payer: 59 | Source: Ambulatory Visit | Attending: Obstetrics & Gynecology | Admitting: Obstetrics & Gynecology

## 2015-09-02 DIAGNOSIS — Z3A01 Less than 8 weeks gestation of pregnancy: Secondary | ICD-10-CM | POA: Insufficient documentation

## 2015-09-02 DIAGNOSIS — R112 Nausea with vomiting, unspecified: Secondary | ICD-10-CM | POA: Insufficient documentation

## 2015-09-02 DIAGNOSIS — O219 Vomiting of pregnancy, unspecified: Secondary | ICD-10-CM

## 2015-09-02 DIAGNOSIS — O218 Other vomiting complicating pregnancy: Secondary | ICD-10-CM | POA: Diagnosis not present

## 2015-09-02 HISTORY — DX: Other complications of anesthesia, initial encounter: T88.59XA

## 2015-09-02 HISTORY — DX: Adverse effect of unspecified anesthetic, initial encounter: T41.45XA

## 2015-09-02 LAB — URINALYSIS, ROUTINE W REFLEX MICROSCOPIC
BILIRUBIN URINE: NEGATIVE
GLUCOSE, UA: NEGATIVE mg/dL
KETONES UR: 15 mg/dL — AB
NITRITE: NEGATIVE
PH: 6 (ref 5.0–8.0)
Protein, ur: NEGATIVE mg/dL
Specific Gravity, Urine: 1.025 (ref 1.005–1.030)

## 2015-09-02 LAB — URINE MICROSCOPIC-ADD ON

## 2015-09-02 LAB — POCT PREGNANCY, URINE: PREG TEST UR: POSITIVE — AB

## 2015-09-02 MED ORDER — METOCLOPRAMIDE HCL 10 MG PO TABS: 10.0000 mg | ORAL_TABLET | Freq: Three times a day (TID) | ORAL | Status: DC | PRN

## 2015-09-02 MED ORDER — PROMETHAZINE HCL 25 MG PO TABS
25.0000 mg | ORAL_TABLET | Freq: Once | ORAL | Status: AC
  Administered 2015-09-02: 25 mg via ORAL
  Filled 2015-09-02: qty 1

## 2015-09-02 NOTE — L&D Delivery Note (Signed)
Delivery Note At 1:37 PM a viable female was delivered via Vaginal, Spontaneous Delivery (Presentation: ROA  ).  APGAR: 9, 9; weight 7 lb 1.6 oz (3220 g).   Placenta status: L&D  Cord:  with the following complications: .  Cord pH: not collected  Anesthesia:  epidural Episiotomy: None Lacerations:  Vaginal wall bilaterally Suture Repair: 3.0 vicryl Est. Blood Loss (mL): 400  Mom to postpartum.  Baby to Couplet care / Skin to Skin.  Myna HidalgoZAN, Reis Pienta, M 04/06/2016, 4:16 PM

## 2015-09-02 NOTE — MAU Note (Signed)
Pos HPT last week, vomiting 5-6 times a day since Thursday, unable to hold liquids down.  Feeling weak & dizzy.  Has lost 10 lbs in 2 weeks.  Denies diarrhea, has upper abd pain from vomiting, no bleeding.

## 2015-09-02 NOTE — MAU Provider Note (Signed)
History     CSN: [id number]  Arrival date and time: 09/02/15 1846   First Provider Initiated Contact with Patient 09/02/15 1926      Chief Complaint  Patient presents with  . Emesis  . Dizziness   HPI Maureen Nelson 30 y.o. G2P1001 @7weeks  presents to MAU complaining of nausea and vomitting.  It has been worse over the last 4-5 days and she is vomitting 5-6 times per day.  She cannot keep anything down.  She vomits within 10-30 minutes after eating or drinking.  She is able to sleep but wakes up feeling nauseous.  It is all day long. With her last pregnancy she was only nauseous in the morning.  She just learned of pregnancy less than a week ago.  Her first prenatal appointment is 1/9.  There is no vaginal bleeding, dysuria, fever, vaginal discharge, abdominal pain.   OB History    Gravida Para Term Preterm AB TAB SAB Ectopic Multiple Living   2 1 1       1       Past Medical History  Diagnosis Date  . Complication of anesthesia     Difficulty "waking up"    Past Surgical History  Procedure Laterality Date  . Spinal fusion    . Cholecystectomy    . Breast lumpectomy    . Back surgery    . Breast lumpectomy      No family history on file.  Social History  Substance Use Topics  . Smoking status: Never Smoker   . Smokeless tobacco: None  . Alcohol Use: No     Comment: Occasional    Allergies:  Allergies  Allergen Reactions  . Dicyclomine Hcl Rash    Prescriptions prior to admission  Medication Sig Dispense Refill Last Dose  . Prenatal Vit-Fe Fumarate-FA (MULTIVITAMIN-PRENATAL) 27-0.8 MG TABS tablet Take 1 tablet by mouth daily.    09/02/2015 at Unknown time    ROS Pertinent ROS in HPI.  All other systems are negative.   Physical Exam   Blood pressure 115/76, pulse 105, temperature 98.2 F (36.8 C), temperature source Oral, resp. rate 16, height 5\' 6"  (1.676 m), weight 196 lb 3.2 oz (88.996 kg), last menstrual period 06/17/2015.  Physical Exam   Constitutional: She is oriented to person, place, and time. She appears well-developed and well-nourished. No distress.  HENT:  Head: Normocephalic and atraumatic.  Eyes: Conjunctivae and EOM are normal.  Neck: Normal range of motion. Neck supple.  Cardiovascular: Normal rate, regular rhythm and normal heart sounds.   Respiratory: Breath sounds normal. No respiratory distress.  GI: Soft. Bowel sounds are normal. She exhibits no distension. There is no tenderness. There is no rebound and no guarding.  Musculoskeletal: Normal range of motion. She exhibits no edema.  Neurological: She is alert and oriented to person, place, and time.  Skin: Skin is warm and dry.  Psychiatric: She has a normal mood and affect. Her behavior is normal.   Results for orders placed or performed during the hospital encounter of 09/02/15 (from the past 24 hour(s))  Urinalysis, Routine w reflex microscopic (not at [facility])     Status: Abnormal   Collection Time: 09/02/15  6:55 PM  Result Value Ref Range   Color, Urine YELLOW YELLOW   APPearance CLEAR CLEAR   Specific Gravity, Urine 1.025 1.005 - 1.030   pH 6.0 5.0 - 8.0   Glucose, UA NEGATIVE NEGATIVE mg/dL   Hgb urine dipstick MODERATE (A) NEGATIVE  Bilirubin Urine NEGATIVE NEGATIVE   Ketones, ur 15 (A) NEGATIVE mg/dL   Protein, ur NEGATIVE NEGATIVE mg/dL   Nitrite NEGATIVE NEGATIVE   Leukocytes, UA SMALL (A) NEGATIVE  Urine microscopic-add on     Status: Abnormal   Collection Time: 09/02/15  6:55 PM  Result Value Ref Range   Squamous Epithelial / LPF 0-5 (A) NONE SEEN   WBC, UA 0-5 0 - 5 WBC/hpf   RBC / HPF 0-5 0 - 5 RBC/hpf   Bacteria, UA FEW (A) NONE SEEN   Urine-Other MUCOUS PRESENT   Pregnancy, urine POC     Status: Abnormal   Collection Time: 09/02/15  7:21 PM  Result Value Ref Range   Preg Test, Ur POSITIVE (A) NEGATIVE    MAU Course  Procedures  MDM Pt prefers oral medication.  She is given po phenergan.   After 45 minutes, she has not  vomited and reports feeling improved.  She is able to keep down sprite and refuses to eat.  She is craving something particular and will wait for that.   Dr. Sallye OberKulwa consulted.  She is agreeable to patient discharge with reglan for nausea at home.  Push po fluids as able.  Return if no improvement.    Assessment and Plan  A:  1. Nausea/vomiting in pregnancy    P: Discharge to home Rx Reglan Push po fluids [facility] Patient may return to MAU as needed or if her condition were to change or worsen   Bertram DenverKaren E Teague Clark 09/02/2015, 7:26 PM

## 2015-09-02 NOTE — Discharge Instructions (Signed)

## 2015-09-02 NOTE — MAU Note (Addendum)
Pt reprts n/v which has been worse the last 2 days. Pt reports vomiting 5-6 times per day. Pt states that she has been on the same 32oz gatorade for 2 days. Pt denies bleeding or cramping.   Pt reports LMP to be October 16th and that she has "irregular periods" . Pt has an appointment scheduled for Jan 9th.

## 2015-09-05 LAB — CULTURE, OB URINE

## 2015-09-10 LAB — OB RESULTS CONSOLE ANTIBODY SCREEN: ANTIBODY SCREEN: NEGATIVE

## 2015-09-10 LAB — OB RESULTS CONSOLE GC/CHLAMYDIA
Chlamydia: NEGATIVE
GC PROBE AMP, GENITAL: NEGATIVE

## 2015-09-10 LAB — OB RESULTS CONSOLE RPR: RPR: NONREACTIVE

## 2015-09-10 LAB — OB RESULTS CONSOLE ABO/RH: RH Type: POSITIVE

## 2015-09-10 LAB — OB RESULTS CONSOLE HEPATITIS B SURFACE ANTIGEN: Hepatitis B Surface Ag: NEGATIVE

## 2015-09-10 LAB — OB RESULTS CONSOLE RUBELLA ANTIBODY, IGM: RUBELLA: IMMUNE

## 2015-09-10 LAB — OB RESULTS CONSOLE HIV ANTIBODY (ROUTINE TESTING): HIV: NONREACTIVE

## 2015-10-28 ENCOUNTER — Encounter (HOSPITAL_COMMUNITY): Payer: Self-pay

## 2015-10-28 ENCOUNTER — Inpatient Hospital Stay (HOSPITAL_COMMUNITY)
Admission: AD | Admit: 2015-10-28 | Discharge: 2015-10-28 | Disposition: A | Discharge status: Home / Self Care | Payer: 59 | Source: Ambulatory Visit | Attending: Obstetrics and Gynecology | Admitting: Obstetrics and Gynecology

## 2015-10-28 ENCOUNTER — Inpatient Hospital Stay (HOSPITAL_COMMUNITY): Payer: 59

## 2015-10-28 DIAGNOSIS — Z981 Arthrodesis status: Secondary | ICD-10-CM | POA: Diagnosis not present

## 2015-10-28 DIAGNOSIS — Z9889 Other specified postprocedural states: Secondary | ICD-10-CM | POA: Insufficient documentation

## 2015-10-28 DIAGNOSIS — M5136 Other intervertebral disc degeneration, lumbar region: Secondary | ICD-10-CM | POA: Diagnosis not present

## 2015-10-28 DIAGNOSIS — R1030 Lower abdominal pain, unspecified: Secondary | ICD-10-CM | POA: Diagnosis present

## 2015-10-28 DIAGNOSIS — R109 Unspecified abdominal pain: Secondary | ICD-10-CM | POA: Diagnosis present

## 2015-10-28 DIAGNOSIS — O26892 Other specified pregnancy related conditions, second trimester: Secondary | ICD-10-CM | POA: Diagnosis not present

## 2015-10-28 DIAGNOSIS — Z3A16 16 weeks gestation of pregnancy: Secondary | ICD-10-CM | POA: Insufficient documentation

## 2015-10-28 DIAGNOSIS — O26899 Other specified pregnancy related conditions, unspecified trimester: Secondary | ICD-10-CM

## 2015-10-28 DIAGNOSIS — R102 Pelvic and perineal pain: Secondary | ICD-10-CM

## 2015-10-28 HISTORY — DX: Other intervertebral disc degeneration, lumbar region without mention of lumbar back pain or lower extremity pain: M51.369

## 2015-10-28 HISTORY — DX: Other intervertebral disc degeneration, lumbar region: M51.36

## 2015-10-28 LAB — URINALYSIS, ROUTINE W REFLEX MICROSCOPIC
BILIRUBIN URINE: NEGATIVE
GLUCOSE, UA: NEGATIVE mg/dL
HGB URINE DIPSTICK: NEGATIVE
Ketones, ur: NEGATIVE mg/dL
Nitrite: NEGATIVE
PROTEIN: NEGATIVE mg/dL
Specific Gravity, Urine: <1.005 — ABNORMAL LOW (ref 1.005–1.030)
pH: 6 (ref 5.0–8.0)

## 2015-10-28 LAB — URINE MICROSCOPIC-ADD ON

## 2015-10-28 MED ORDER — CYCLOBENZAPRINE HCL 5 MG PO TABS
5.0000 mg | ORAL_TABLET | Freq: Once | ORAL | Status: AC
  Administered 2015-10-28: 5 mg via ORAL
  Filled 2015-10-28: qty 1

## 2015-10-28 MED ORDER — CYCLOBENZAPRINE HCL 10 MG PO TABS
10.0000 mg | ORAL_TABLET | Freq: Two times a day (BID) | ORAL | Status: DC | PRN
Start: 2015-10-28 — End: 2016-02-07

## 2015-10-28 NOTE — Discharge Instructions (Signed)
Round Ligament Pain  The round ligament is a cord of muscle and tissue that helps to support the uterus. It can become a source of pain during pregnancy if it becomes stretched or twisted as the baby grows. The pain usually begins in the second trimester of pregnancy, and it can come and go until the baby is delivered. It is not a serious problem, and it does not cause harm to the baby.  Round ligament pain is usually a short, sharp, and pinching pain, but it can also be a dull, lingering, and aching pain. The pain is felt in the lower side of the abdomen or in the groin. It usually starts deep in the groin and moves up to the outside of the hip area. Pain can occur with:   A sudden change in position.   Rolling over in bed.   Coughing or sneezing.   Physical activity.  HOME CARE INSTRUCTIONS  Watch your condition for any changes. Take these steps to help with your pain:   When the pain starts, relax. Then try:    Sitting down.    Flexing your knees up to your abdomen.    Lying on your side with one pillow under your abdomen and another pillow between your legs.    Sitting in a warm bath for 15-20 minutes or until the pain goes away.   Take over-the-counter and prescription medicines only as told by your health care provider.   Move slowly when you sit and stand.   Avoid long walks if they cause pain.   Stop or lessen your physical activities if they cause pain.  SEEK MEDICAL CARE IF:   Your pain does not go away with treatment.   You feel pain in your back that you did not have before.   Your medicine is not helping.  SEEK IMMEDIATE MEDICAL CARE IF:   You develop a fever or chills.   You develop uterine contractions.   You develop vaginal bleeding.   You develop nausea or vomiting.   You develop diarrhea.   You have pain when you urinate.     This information is not intended to replace advice given to you by your health care provider. Make sure you discuss any questions you have with your health  care provider.     Document Released: 05/27/2008 Document Revised: 11/10/2011 Document Reviewed: 10/25/2014  Elsevier Interactive Patient Education 2016 [employer]

## 2015-10-28 NOTE — MAU Note (Addendum)
Onset of groin and vaginal pain 3 days ago today worse radiating down into thighs, denies vaginal bleeding, denies dysuria, no constipation was seen in MD office on Friday and everything checked out okay.

## 2015-10-28 NOTE — MAU Provider Note (Signed)
History     CSN: [id number]  Arrival date and time: 10/28/15 4098   First Provider Initiated Contact with Patient 10/28/15 1803      Chief Complaint  Patient presents with  . Abdominal Pain  . Groin Pain   HPI Comments: Maureen Nelson is a 30 y.o. G2P1001 at [redacted]w[redacted]d presenting with constant groin pain for the last 2 weeks, worse for the last 4 days. She had an office visit for this problem on Friday but it has worsened despite maximum doses of Tylenol.    Abdominal Pain This is a new problem. The current episode started in the past 7 days. The onset quality is gradual. The problem occurs constantly. The most recent episode lasted 3 weeks. The problem has been gradually worsening. Pain location: bilateral groin pain and vaginal pain. The pain is at a severity of 8/10. The pain is severe. Pain radiation: thighs. Pertinent negatives include no constipation, diarrhea, dysuria, fever, frequency, hematuria, nausea or vomiting. The pain is aggravated by certain positions, coughing and movement. The pain is relieved by being still (Lying down with hips flexed improves discomfort). She has tried acetaminophen for the symptoms. The treatment provided no relief.   Prenatal course uncomplicated with PMD Dr. Charlotta Newton.  OB History  Gravida Para Term Preterm AB SAB TAB Ectopic Multiple Living  # Outcome Date GA Lbr Len/2nd Weight Sex Delivery Anes PTL Lv  2 Current           1 Term 11/28/10    F Vag-Spont   Y       Past Medical History  Diagnosis Date  . Complication of anesthesia     Difficulty "waking up"  . Degenerative disc disease, lumbar     Past Surgical History  Procedure Laterality Date  . Spinal fusion    . Cholecystectomy    . Breast lumpectomy    . Back surgery    . Breast lumpectomy      No family history on file.  Social History  Substance Use Topics  . Smoking status: Never Smoker   . Smokeless tobacco: None  . Alcohol Use: No     Comment:  Occasional    Allergies:  Allergies  Allergen Reactions  . Dicyclomine Hcl Rash    Prescriptions prior to admission  Medication Sig Dispense Refill Last Dose  . metoCLOPramide (REGLAN) 10 MG tablet Take 1 tablet (10 mg total) by mouth 3 (three) times daily as needed for nausea. 30 tablet 0   . Prenatal Vit-Fe Fumarate-FA (MULTIVITAMIN-PRENATAL) 27-0.8 MG TABS tablet Take 1 tablet by mouth daily.    09/02/2015 at Unknown time    Review of Systems  Constitutional: Negative for fever and chills.  Gastrointestinal: Positive for abdominal pain. Negative for nausea, vomiting, diarrhea and constipation.       Localized to right and left groin. No abdominal cramping.  Genitourinary: Negative for dysuria, urgency, frequency, hematuria and flank pain.  Musculoskeletal: Negative for back pain.   Physical Exam   Blood pressure 120/68, pulse 91, temperature 97.7 F (36.5 C), temperature source Oral, resp. rate 18, last menstrual period 06/17/2015.  Physical Exam  Nursing note and vitals reviewed. Constitutional: She appears well-developed and well-nourished. She appears distressed.  Grimacing in apparent discomfort especially with position changes  HENT:  Head: Normocephalic.  Eyes: Pupils are equal, round, and reactive to light.  Neck: Normal range of motion.  Cardiovascular: Normal  rate.   Respiratory: Effort normal.  GI: There is no tenderness. There is no guarding.  Minimal TTP groin  Genitourinary:  SVE: long, closed    MAU Course  Procedures Results for orders placed or performed during the hospital encounter of 10/28/15 (from the past 24 hour(s))  Urinalysis, Routine w reflex microscopic (not at [facility])     Status: Abnormal   Collection Time: 10/28/15  5:50 PM  Result Value Ref Range   Color, Urine YELLOW YELLOW   APPearance CLEAR CLEAR   Specific Gravity, Urine <1.005 (L) 1.005 - 1.030   pH 6.0 5.0 - 8.0   Glucose, UA NEGATIVE NEGATIVE mg/dL   Hgb urine dipstick NEGATIVE  NEGATIVE   Bilirubin Urine NEGATIVE NEGATIVE   Ketones, ur NEGATIVE NEGATIVE mg/dL   Protein, ur NEGATIVE NEGATIVE mg/dL   Nitrite NEGATIVE NEGATIVE   Leukocytes, UA TRACE (A) NEGATIVE  Urine microscopic-add on     Status: Abnormal   Collection Time: 10/28/15  5:50 PM  Result Value Ref Range   Squamous Epithelial / LPF 0-5 (A) NONE SEEN   WBC, UA 0-5 0 - 5 WBC/hpf   RBC / HPF 0-5 0 - 5 RBC/hpf   Bacteria, UA FEW (A) NONE SEEN   Flexeril  po given Dr. Dion Body in to see patient Assessment and Plan   1. Pain of round ligament affecting pregnancy, antepartum   2. Abdominal pain affecting pregnancy   3. [redacted] weeks gestation of pregnancy     Evertte Sones 10/28/2015, 6:04 PM

## 2015-11-02 ENCOUNTER — Emergency Department (HOSPITAL_COMMUNITY)
Admission: EM | Admit: 2015-11-02 | Discharge: 2015-11-02 | Disposition: A | Discharge status: Home / Self Care | Payer: 59 | Attending: Emergency Medicine | Admitting: Emergency Medicine

## 2015-11-02 ENCOUNTER — Encounter (HOSPITAL_COMMUNITY): Payer: Self-pay

## 2015-11-02 DIAGNOSIS — Y998 Other external cause status: Secondary | ICD-10-CM | POA: Insufficient documentation

## 2015-11-02 DIAGNOSIS — Y9389 Activity, other specified: Secondary | ICD-10-CM | POA: Diagnosis not present

## 2015-11-02 DIAGNOSIS — T148 Other injury of unspecified body region: Secondary | ICD-10-CM | POA: Insufficient documentation

## 2015-11-02 DIAGNOSIS — Z8739 Personal history of other diseases of the musculoskeletal system and connective tissue: Secondary | ICD-10-CM | POA: Diagnosis not present

## 2015-11-02 DIAGNOSIS — Z79899 Other long term (current) drug therapy: Secondary | ICD-10-CM | POA: Diagnosis not present

## 2015-11-02 DIAGNOSIS — S4992XA Unspecified injury of left shoulder and upper arm, initial encounter: Secondary | ICD-10-CM | POA: Insufficient documentation

## 2015-11-02 DIAGNOSIS — Z3A17 17 weeks gestation of pregnancy: Secondary | ICD-10-CM | POA: Insufficient documentation

## 2015-11-02 DIAGNOSIS — T07XXXA Unspecified multiple injuries, initial encounter: Secondary | ICD-10-CM

## 2015-11-02 DIAGNOSIS — O9A212 Injury, poisoning and certain other consequences of external causes complicating pregnancy, second trimester: Secondary | ICD-10-CM | POA: Insufficient documentation

## 2015-11-02 DIAGNOSIS — Y92410 Unspecified street and highway as the place of occurrence of the external cause: Secondary | ICD-10-CM | POA: Insufficient documentation
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
## 2015-11-02 NOTE — Discharge Instructions (Signed)
Routine follow up with OB/GYN.  Rest X 24 hours.  Report any vaginal bleeding to OB/GYN.  Third ER with any new/worsening symptoms
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
## 2015-11-02 NOTE — ED Notes (Signed)
Bed: WA05 Expected date:  Expected time:  Means of arrival:  Comments: EMS 

## 2015-11-08 ENCOUNTER — Encounter (HOSPITAL_COMMUNITY): Payer: Self-pay

## 2015-11-08 ENCOUNTER — Other Ambulatory Visit (HOSPITAL_COMMUNITY): Payer: Self-pay | Admitting: Obstetrics & Gynecology

## 2015-11-08 ENCOUNTER — Inpatient Hospital Stay (HOSPITAL_COMMUNITY)
Admission: AD | Admit: 2015-11-08 | Discharge: 2015-11-08 | Disposition: A | Discharge status: Home / Self Care | Payer: 59 | Source: Ambulatory Visit | Attending: Obstetrics & Gynecology | Admitting: Obstetrics & Gynecology

## 2015-11-08 ENCOUNTER — Ambulatory Visit (HOSPITAL_COMMUNITY)
Admission: RE | Admit: 2015-11-08 | Discharge: 2015-11-08 | Disposition: A | Discharge status: Home / Self Care | Payer: 59 | Source: Ambulatory Visit | Attending: Obstetrics & Gynecology | Admitting: Obstetrics & Gynecology

## 2015-11-08 DIAGNOSIS — Z3A17 17 weeks gestation of pregnancy: Secondary | ICD-10-CM

## 2015-11-08 DIAGNOSIS — O209 Hemorrhage in early pregnancy, unspecified: Secondary | ICD-10-CM | POA: Diagnosis not present

## 2015-11-08 DIAGNOSIS — O26872 Cervical shortening, second trimester: Secondary | ICD-10-CM

## 2015-11-08 DIAGNOSIS — N949 Unspecified condition associated with female genital organs and menstrual cycle: Secondary | ICD-10-CM | POA: Diagnosis not present

## 2015-11-08 DIAGNOSIS — Z36 Encounter for antenatal screening of mother: Secondary | ICD-10-CM

## 2015-11-08 DIAGNOSIS — R109 Unspecified abdominal pain: Secondary | ICD-10-CM | POA: Diagnosis present

## 2015-11-08 DIAGNOSIS — Z3689 Encounter for other specified antenatal screening: Secondary | ICD-10-CM

## 2015-11-08 DIAGNOSIS — R102 Pelvic and perineal pain: Secondary | ICD-10-CM | POA: Insufficient documentation

## 2015-11-08 DIAGNOSIS — O9989 Other specified diseases and conditions complicating pregnancy, childbirth and the puerperium: Secondary | ICD-10-CM | POA: Diagnosis not present

## 2015-11-08 DIAGNOSIS — O4692 Antepartum hemorrhage, unspecified, second trimester: Secondary | ICD-10-CM

## 2015-11-08 LAB — WET PREP, GENITAL
CLUE CELLS WET PREP: NONE SEEN
Sperm: NONE SEEN
TRICH WET PREP: NONE SEEN
YEAST WET PREP: NONE SEEN

## 2015-11-08 LAB — URINALYSIS, ROUTINE W REFLEX MICROSCOPIC
BILIRUBIN URINE: NEGATIVE
Glucose, UA: NEGATIVE mg/dL
Hgb urine dipstick: NEGATIVE
KETONES UR: 15 mg/dL — AB
LEUKOCYTES UA: NEGATIVE
NITRITE: NEGATIVE
PH: 6 (ref 5.0–8.0)
Protein, ur: NEGATIVE mg/dL
SPECIFIC GRAVITY, URINE: 1.02 (ref 1.005–1.030)

## 2015-11-08 MED ORDER — CYCLOBENZAPRINE HCL 10 MG PO TABS: 10.0000 mg | ORAL_TABLET | Freq: Once | ORAL | Status: DC

## 2015-11-08 MED ORDER — IBUPROFEN 600 MG PO TABS
600.0000 mg | ORAL_TABLET | Freq: Once | ORAL | Status: AC
  Administered 2015-11-08: 600 mg via ORAL
  Filled 2015-11-08: qty 1

## 2015-11-08 NOTE — MAU Note (Signed)
Pt sent from MFM for cramping and spotting.

## 2015-11-08 NOTE — MAU Note (Signed)
Provider with pt in hallway,

## 2015-11-08 NOTE — MAU Provider Note (Signed)
History     CSN: [id number]  Arrival date and time: 11/08/15 1651   None     Chief Complaint  Patient presents with  . Abdominal Cramping  . Vaginal Bleeding   Abdominal Cramping This is a new problem. The current episode started today. The onset quality is gradual. The problem occurs intermittently. The problem has been unchanged. The pain is located in the suprapubic region. The pain is at a severity of 4/10. The quality of the pain is cramping. The abdominal pain does not radiate. Associated symptoms include vomiting. Pertinent negatives include no constipation, diarrhea, dysuria, fever, frequency or nausea. Nothing aggravates the pain. The pain is relieved by nothing. She has tried nothing for the symptoms.    Past Medical History  Diagnosis Date  . Complication of anesthesia     Difficulty "waking up"  . Degenerative disc disease, lumbar     Past Surgical History  Procedure Laterality Date  . Spinal fusion    . Cholecystectomy    . Breast lumpectomy    . Back surgery    . Breast lumpectomy      No family history on file.  Social History  Substance Use Topics  . Smoking status: Never Smoker   . Smokeless tobacco: Not on file  . Alcohol Use: No     Comment: Occasional    Allergies:  Allergies  Allergen Reactions  . Dicyclomine Hcl Rash    Prescriptions prior to admission  Medication Sig Dispense Refill Last Dose  . acetaminophen (TYLENOL) 500 MG tablet Take 1,000 mg by mouth every 6 (six) hours as needed for mild pain or headache.   11/01/2015 at 1600  . cyclobenzaprine (FLEXERIL) 10 MG tablet Take 1 tablet (10 mg total) by mouth 2 (two) times daily as needed for muscle spasms. 20 tablet 0 11/01/2015 at 1600  . metoCLOPramide (REGLAN) 10 MG tablet Take 10 mg by mouth 3 (three) times daily as needed for nausea.   0 Past Week at Unknown time  . Prenatal Vit-Fe Fumarate-FA (MULTIVITAMIN-PRENATAL) 27-0.8 MG TABS tablet Take 1 tablet by mouth at bedtime.    11/01/2015  at Unknown time    Review of Systems  Constitutional: Negative for fever and chills.  Gastrointestinal: Positive for vomiting and abdominal pain. Negative for nausea, diarrhea and constipation.  Genitourinary: Negative for dysuria, urgency and frequency.   Physical Exam   Blood pressure 118/74, pulse 99, temperature 98.1 F (36.7 C), temperature source Oral, resp. rate 18, height  (1.702 m), weight 89.54 kg (197 lb 6.4 oz), last menstrual period 06/17/2015.  Physical Exam  Nursing note and vitals reviewed. Constitutional: She is oriented to person, place, and time. She appears well-developed and well-nourished. No distress.  HENT:  Head: Normocephalic.  Cardiovascular: Normal rate.   Respiratory: Effort normal.  GI: Soft. There is no tenderness. There is no rebound.  Genitourinary:  Cervix: closed/thick/high   Neurological: She is alert and oriented to person, place, and time.  Skin: Skin is warm and dry.  Psychiatric: She has a normal mood and affect.   Toco: no UCs,  MAU Course  Procedures  MDM 2124: DW Dr. Su Hilt, ok to have a dose of motrin while here. Will send UC and GC/CT culture. Then may be dc home.  2224: Patient reports that her pain is better after having motrin.   Assessment and Plan   1. Round ligament pain    DC home Comfort measures reviewed  1st Trimester precautions  Bleeding  precautions RX: none  Return to MAU as needed FU with OB as planned  Follow-up Information    Follow up with Myna HidalgoZAN, JENNIFER, M, DO.   Specialty:  Obstetrics and Gynecology   Why:  As scheduled   Contact information:   [address] [place] [address] [phone]         Tawnya CrookHogan, Heather Donovan 11/08/2015, 8:41 PM

## 2015-11-08 NOTE — Discharge Instructions (Signed)

## 2015-11-09 ENCOUNTER — Other Ambulatory Visit (HOSPITAL_COMMUNITY): Payer: Self-pay | Admitting: *Deleted

## 2015-11-09 DIAGNOSIS — Z0489 Encounter for examination and observation for other specified reasons: Secondary | ICD-10-CM

## 2015-11-09 DIAGNOSIS — IMO0002 Reserved for concepts with insufficient information to code with codable children: Secondary | ICD-10-CM

## 2015-11-09 LAB — GC/CHLAMYDIA PROBE AMP (~~LOC~~) NOT AT ARMC
Chlamydia: NEGATIVE
Neisseria Gonorrhea: NEGATIVE

## 2015-11-10 LAB — URINE CULTURE: Culture: 1000

## 2015-11-18 ENCOUNTER — Encounter (HOSPITAL_COMMUNITY): Payer: Self-pay | Admitting: Certified Nurse Midwife

## 2015-11-18 ENCOUNTER — Inpatient Hospital Stay (HOSPITAL_COMMUNITY)
Admission: AD | Admit: 2015-11-18 | Discharge: 2015-11-18 | Disposition: A | Discharge status: Home / Self Care | Payer: 59 | Source: Ambulatory Visit | Attending: Obstetrics & Gynecology | Admitting: Obstetrics & Gynecology

## 2015-11-18 DIAGNOSIS — R1030 Lower abdominal pain, unspecified: Secondary | ICD-10-CM | POA: Diagnosis present

## 2015-11-18 DIAGNOSIS — R109 Unspecified abdominal pain: Secondary | ICD-10-CM

## 2015-11-18 DIAGNOSIS — Z3A19 19 weeks gestation of pregnancy: Secondary | ICD-10-CM | POA: Insufficient documentation

## 2015-11-18 DIAGNOSIS — M5136 Other intervertebral disc degeneration, lumbar region: Secondary | ICD-10-CM | POA: Diagnosis not present

## 2015-11-18 DIAGNOSIS — O210 Mild hyperemesis gravidarum: Secondary | ICD-10-CM | POA: Diagnosis not present

## 2015-11-18 DIAGNOSIS — O26899 Other specified pregnancy related conditions, unspecified trimester: Secondary | ICD-10-CM

## 2015-11-18 DIAGNOSIS — O26892 Other specified pregnancy related conditions, second trimester: Secondary | ICD-10-CM | POA: Insufficient documentation

## 2015-11-18 DIAGNOSIS — O9989 Other specified diseases and conditions complicating pregnancy, childbirth and the puerperium: Secondary | ICD-10-CM | POA: Diagnosis not present

## 2015-11-18 LAB — URINALYSIS, ROUTINE W REFLEX MICROSCOPIC
BILIRUBIN URINE: NEGATIVE
GLUCOSE, UA: NEGATIVE mg/dL
Hgb urine dipstick: NEGATIVE
KETONES UR: NEGATIVE mg/dL
Leukocytes, UA: NEGATIVE
NITRITE: NEGATIVE
PH: 5.5 (ref 5.0–8.0)
Protein, ur: NEGATIVE mg/dL
Specific Gravity, Urine: 1.02 (ref 1.005–1.030)

## 2015-11-18 LAB — CBC
HCT: 34.1 % — ABNORMAL LOW (ref 36.0–46.0)
Hemoglobin: 11.9 g/dL — ABNORMAL LOW (ref 12.0–15.0)
MCH: 30.7 pg (ref 26.0–34.0)
MCHC: 34.9 g/dL (ref 30.0–36.0)
MCV: 88.1 fL (ref 78.0–100.0)
PLATELETS: 241 10*3/uL (ref 150–400)
RBC: 3.87 MIL/uL (ref 3.87–5.11)
RDW: 12.8 % (ref 11.5–15.5)
WBC: 9.1 10*3/uL (ref 4.0–10.5)

## 2015-11-18 MED ORDER — ACETAMINOPHEN 500 MG PO TABS: 1000.0000 mg | ORAL_TABLET | Freq: Once | ORAL | Status: DC

## 2015-11-18 NOTE — Discharge Instructions (Signed)

## 2015-11-18 NOTE — MAU Note (Signed)
Pt states she has been cramping all day despite resting.

## 2015-11-18 NOTE — MAU Provider Note (Signed)
History     CSN: [id number]  Arrival date and time: 11/18/15 1727   None     Chief Complaint  Patient presents with  . Abdominal Cramping   HPI Pt is 19w0 pregnant G2P1001 who presents with abdominal cramping. Pt describes lower abdominal pain like cramps.  Pt states they come and go- they woke her up at 3:30 am- pt has taken warm bath, Flexeril at 8 am and tylenol at 4 pm without relief of the pain.  Pt denies pain with urination, constipation or diarrhea.   Pt has some nausea and vomiting, which she takes Reglan for with relief.  Today she had cheese and mushroom pizza with pepsi and water at 2:30 pm without N/V Pt has been seen for round ligament pain that she takes Flexeril for which helps, but did not improve this pain.   Pt had some spotting 1 1/2 weeks ago and had an OB exam with wet prep, which was totally normal, and cervix was closed, long and thick.  Pt denies any vaginal discharge, LOF or bleeding.  Pt has not had IC for several weeks. Pt had some dizziness on Wednesday with headache, which was relieved with Tylenol and fluids and rest. 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 Pt denies previous preterm delivery- had preterm labor but was induced at term Pt has hx of degenerative disc disease, so admits to back pain from that. RN note: Yetta BarreJones, RN (Registered Nurse)     Expand All Collapse All   Pt states she has been cramping all day despite resting.       Past Medical History  Diagnosis Date  . Complication of anesthesia     Difficulty "waking up"  . Degenerative disc disease, lumbar     Past Surgical History  Procedure Laterality Date  . Spinal fusion    . Cholecystectomy    . Breast lumpectomy    . Back surgery    . Breast lumpectomy      Family History  Problem Relation Age of Onset  . Heart disease Maternal Grandmother   . Cancer Maternal Grandfather   . Cancer Paternal Grandfather     Social History  Substance Use Topics  .  Smoking status: Never Smoker   . Smokeless tobacco: None  . Alcohol Use: No     Comment: Occasional    Allergies:  Allergies  Allergen Reactions  . Dicyclomine Hcl Rash    Prescriptions prior to admission  Medication Sig Dispense Refill Last Dose  . acetaminophen (TYLENOL) 500 MG tablet Take 1,000 mg by mouth every 6 (six) hours as needed for mild pain or headache.   11/08/2015 at Unknown time  . cyclobenzaprine (FLEXERIL) 10 MG tablet Take 1 tablet (10 mg total) by mouth 2 (two) times daily as needed for muscle spasms. 20 tablet 0 11/07/2015 at Unknown time  . metoCLOPramide (REGLAN) 10 MG tablet Take 10 mg by mouth 3 (three) times daily as needed for nausea.   0 Past Week at Unknown time  . Prenatal Vit-Fe Fumarate-FA (MULTIVITAMIN-PRENATAL) 27-0.8 MG TABS tablet Take 1 tablet by mouth at bedtime.    11/07/2015 at Unknown time    Review of Systems  Constitutional: Negative for fever and chills.  Respiratory: Negative for cough.   Cardiovascular: Negative for chest pain.  Gastrointestinal: Positive for nausea and abdominal pain. Negative for vomiting, diarrhea and constipation.  Genitourinary: Negative for dysuria.  Musculoskeletal: Positive for back pain.  Neurological: Negative  for headaches.   Physical Exam   Blood pressure 122/67, pulse 113, temperature 97.2 F (36.2 C), temperature source Oral, resp. rate 20, last menstrual period 06/17/2015.  Physical Exam  Nursing note and vitals reviewed. Constitutional: She is oriented to person, place, and time. She appears well-developed and well-nourished. No distress.  HENT:  Head: Normocephalic.  Eyes: Pupils are equal, round, and reactive to light.  Neck: Normal range of motion. Neck supple.  Cardiovascular: Normal rate.   Respiratory: Effort normal.  GI: Soft. She exhibits no distension. There is tenderness. There is no rebound and no guarding.  FHT 147 bpm with doppler  Genitourinary: Vagina normal.  Vagina clean, cervix  clean, closed, long, thick; uterus AGA- mild tenderness lower uterine segment  Musculoskeletal: Normal range of motion.  Neurological: She is alert and oriented to person, place, and time.  Skin: Skin is warm and dry.  Psychiatric: She has a normal mood and affect.    MAU Course  Procedures Results for orders placed or performed during the hospital encounter of 11/18/15 (from the past 24 hour(s))  Urinalysis, Routine w reflex microscopic (not at [facility])     Status: None   Collection Time: 11/18/15  5:35 PM  Result Value Ref Range   Color, Urine YELLOW YELLOW   APPearance CLEAR CLEAR   Specific Gravity, Urine 1.020 1.005 - 1.030   pH 5.5 5.0 - 8.0   Glucose, UA NEGATIVE NEGATIVE mg/dL   Hgb urine dipstick NEGATIVE NEGATIVE   Bilirubin Urine NEGATIVE NEGATIVE   Ketones, ur NEGATIVE NEGATIVE mg/dL   Protein, ur NEGATIVE NEGATIVE mg/dL   Nitrite NEGATIVE NEGATIVE   Leukocytes, UA NEGATIVE NEGATIVE  CBC     Status: Abnormal   Collection Time: 11/18/15  6:20 PM  Result Value Ref Range   WBC 9.1 4.0 - 10.5 K/uL   RBC 3.87 3.87 - 5.11 MIL/uL   Hemoglobin 11.9 (L) 12.0 - 15.0 g/dL   HCT 16.1 (L) 09.6 - 04.5 %   MCV 88.1 78.0 - 100.0 fL   MCH 30.7 26.0 - 34.0 pg   MCHC 34.9 30.0 - 36.0 g/dL   RDW 40.9 81.1 - 91.4 %   Platelets 241 150 - 400 K/uL  discussed with Dr. Estanislado Pandy No abnormal physical findings- will treat symptomatically  Assessment and Plan  abd pain in pregnancy- ibuprofen  every 6 hours prn pain Degenerative disc disease- back exercises F/u this week with Dr. Charlotta Newton Return sooner for any concerns or increase in symtpoms   Roger Kettles 11/18/2015, 5:52 PM

## 2015-11-21 ENCOUNTER — Inpatient Hospital Stay (HOSPITAL_COMMUNITY)
Admission: AD | Admit: 2015-11-21 | Discharge: 2015-11-22 | Disposition: A | Discharge status: Home / Self Care | Payer: 59 | Source: Ambulatory Visit | Attending: Obstetrics and Gynecology | Admitting: Obstetrics and Gynecology

## 2015-11-21 ENCOUNTER — Ambulatory Visit (HOSPITAL_COMMUNITY)
Admission: RE | Admit: 2015-11-21 | Discharge: 2015-11-21 | Disposition: A | Discharge status: Home / Self Care | Payer: 59 | Source: Ambulatory Visit | Attending: Obstetrics & Gynecology | Admitting: Obstetrics & Gynecology

## 2015-11-21 ENCOUNTER — Encounter (HOSPITAL_COMMUNITY): Payer: Self-pay | Admitting: *Deleted

## 2015-11-21 ENCOUNTER — Other Ambulatory Visit (HOSPITAL_COMMUNITY): Payer: Self-pay | Admitting: Obstetrics and Gynecology

## 2015-11-21 ENCOUNTER — Encounter (HOSPITAL_COMMUNITY): Payer: Self-pay

## 2015-11-21 DIAGNOSIS — Z3A19 19 weeks gestation of pregnancy: Secondary | ICD-10-CM

## 2015-11-21 DIAGNOSIS — O468X9 Other antepartum hemorrhage, unspecified trimester: Secondary | ICD-10-CM

## 2015-11-21 DIAGNOSIS — O418X90 Other specified disorders of amniotic fluid and membranes, unspecified trimester, not applicable or unspecified: Secondary | ICD-10-CM

## 2015-11-21 DIAGNOSIS — Z0489 Encounter for examination and observation for other specified reasons: Secondary | ICD-10-CM

## 2015-11-21 DIAGNOSIS — A084 Viral intestinal infection, unspecified: Secondary | ICD-10-CM | POA: Insufficient documentation

## 2015-11-21 DIAGNOSIS — O4592 Premature separation of placenta, unspecified, second trimester: Secondary | ICD-10-CM

## 2015-11-21 DIAGNOSIS — O99612 Diseases of the digestive system complicating pregnancy, second trimester: Secondary | ICD-10-CM | POA: Diagnosis not present

## 2015-11-21 DIAGNOSIS — IMO0002 Reserved for concepts with insufficient information to code with codable children: Secondary | ICD-10-CM

## 2015-11-21 DIAGNOSIS — O210 Mild hyperemesis gravidarum: Secondary | ICD-10-CM | POA: Diagnosis present

## 2015-11-21 DIAGNOSIS — Z36 Encounter for antenatal screening of mother: Secondary | ICD-10-CM

## 2015-11-21 LAB — URINALYSIS, ROUTINE W REFLEX MICROSCOPIC
Glucose, UA: NEGATIVE mg/dL
Hgb urine dipstick: NEGATIVE
KETONES UR: 15 mg/dL — AB
NITRITE: NEGATIVE
PH: 5.5 (ref 5.0–8.0)
Protein, ur: NEGATIVE mg/dL
SPECIFIC GRAVITY, URINE: 1.025 (ref 1.005–1.030)

## 2015-11-21 LAB — URINE MICROSCOPIC-ADD ON

## 2015-11-21 MED ORDER — DIPHENHYDRAMINE HCL 25 MG PO CAPS
25.0000 mg | ORAL_CAPSULE | Freq: Once | ORAL | Status: AC
  Administered 2015-11-22: 25 mg via ORAL
  Filled 2015-11-21: qty 1

## 2015-11-21 MED ORDER — PROMETHAZINE HCL 25 MG/ML IJ SOLN
25.0000 mg | Freq: Once | INTRAVENOUS | Status: AC
  Administered 2015-11-21: 25 mg via INTRAVENOUS
  Filled 2015-11-21: qty 1

## 2015-11-21 MED ORDER — LACTATED RINGERS IV BOLUS (SEPSIS)
1000.0000 mL | Freq: Once | INTRAVENOUS | Status: AC
  Administered 2015-11-21: 1000 mL via INTRAVENOUS

## 2015-11-21 NOTE — MAU Provider Note (Signed)
History     CSN: [id number]  Arrival date and time: 11/21/15 2015   First Provider Initiated Contact with Patient 11/21/15 2112      Chief Complaint  Patient presents with  . Emesis  . Diarrhea   HPI Comments: Maureen Nelson is a 30 y.o. G2P1001 at 8139w3d who presents today with n/v/d and fever since yesterday. She denies any abdominal pain, vaginal bleeding or LOF. She has felt fetal movement. She reports that her daughter was sick earlier in the week.   Emesis  This is a new problem. The current episode started yesterday. The problem occurs more than 10 times per day. The problem has been unchanged. The emesis has an appearance of stomach contents. The maximum temperature recorded prior to her arrival was 101 - 101.9 F (at home, yesterday. No fever today ). Associated symptoms include chills, diarrhea, dizziness and a fever. Pertinent negatives include no abdominal pain. Risk factors include ill contacts. Treatments tried: phenergan  The treatment provided no relief.  Diarrhea  This is a new problem. The current episode started today. The problem occurs 5 to 10 times per day. The problem has been gradually improving. The stool consistency is described as watery. Associated symptoms include chills, a fever and vomiting. Pertinent negatives include no abdominal pain. Nothing aggravates the symptoms. Risk factors include ill contacts. She has tried nothing for the symptoms.    Past Medical History  Diagnosis Date  . Complication of anesthesia     Difficulty "waking up"  . Degenerative disc disease, lumbar     Past Surgical History  Procedure Laterality Date  . Spinal fusion    . Cholecystectomy    . Breast lumpectomy    . Back surgery    . Breast lumpectomy      Family History  Problem Relation Age of Onset  . Heart disease Maternal Grandmother   . Cancer Maternal Grandfather   . Cancer Paternal Grandfather     Social History  Substance Use Topics  . Smoking status:  Never Smoker   . Smokeless tobacco: None  . Alcohol Use: No     Comment: Occasional    Allergies:  Allergies  Allergen Reactions  . Dicyclomine Hcl Rash    Prescriptions prior to admission  Medication Sig Dispense Refill Last Dose  . acetaminophen (TYLENOL) 500 MG tablet Take 1,000 mg by mouth every 6 (six) hours as needed for mild pain or headache.   11/21/2015 at Unknown time  . cyclobenzaprine (FLEXERIL) 10 MG tablet Take 1 tablet (10 mg total) by mouth 2 (two) times daily as needed for muscle spasms. 20 tablet 0 Past Week at Unknown time  . metoCLOPramide (REGLAN) 10 MG tablet Take 10 mg by mouth 3 (three) times daily as needed for nausea.   0 Past Week at Unknown time  . Prenatal Vit-Fe Fumarate-FA (MULTIVITAMIN-PRENATAL) 27-0.8 MG TABS tablet Take 1 tablet by mouth at bedtime.    11/20/2015 at Unknown time  . promethazine (PHENERGAN) 25 MG suppository Place 25 mg rectally every 6 (six) hours as needed for nausea or vomiting.   11/21/2015 at Unknown time    Review of Systems  Constitutional: Positive for fever and chills.  Gastrointestinal: Positive for nausea, vomiting and diarrhea. Negative for abdominal pain.  Genitourinary: Negative for dysuria, urgency and frequency.  Neurological: Positive for dizziness.   Physical Exam   Blood pressure 103/70, pulse 111, temperature 98.1 F (36.7 C), temperature source Oral, resp. rate 16, height 5\' 7"  (  1.702 m), weight 86.183 kg (190 lb), last menstrual period 06/17/2015, SpO2 99 %.  Physical Exam  Nursing note and vitals reviewed. Constitutional: She is oriented to person, place, and time. She appears well-developed and well-nourished. No distress.  HENT:  Head: Normocephalic.  Cardiovascular: Normal rate.   Respiratory: Effort normal.  GI: Soft. There is no tenderness. There is no rebound.  Neurological: She is alert and oriented to person, place, and time.  Skin: Skin is warm and dry.  Psychiatric: She has a normal mood and  affect.    MAU Course  Procedures  MDM 0010: Dr. Su Hilt on the unit, reviewed with her. Ok for [employer] home when fluids are finished. Can have RX for zofran.   Assessment and Plan   1. Viral gastroenteritis   2. [redacted] weeks gestation of pregnancy    DC home Comfort measures reviewed  2nd Trimester precautions  PTL precautions  Fetal kick counts RX: zofran  ODT #20  Return to MAU as needed FU with OB as planned  Follow-up Information    Follow up with Geryl Rankins, MD.   Specialty:  Obstetrics and Gynecology   Why:  As scheduled   Contact information:   [address] [place] [address] [phone]         Maureen Nelson 11/21/2015, 9:30 PM

## 2015-11-21 NOTE — MAU Note (Signed)
Pt reports vomiting and fever on Tuesday, was seen at office and was given phenergan but has not been able to keep anything down including the phenergan. Diarrhea since 1630.

## 2015-11-22 DIAGNOSIS — A084 Viral intestinal infection, unspecified: Secondary | ICD-10-CM

## 2015-11-22 MED ORDER — ONDANSETRON 8 MG PO TBDP: 8.0000 mg | ORAL_TABLET | Freq: Three times a day (TID) | ORAL | Status: DC | PRN

## 2015-11-22 NOTE — Discharge Instructions (Signed)
Food Choices to Help Relieve Diarrhea, Adult °When you have diarrhea, the foods you eat and your eating habits are very important. Choosing the right foods and drinks can help relieve diarrhea. Also, because diarrhea can last up to 7 days, you need to replace lost fluids and electrolytes (such as sodium, potassium, and chloride) in order to help prevent dehydration.  °WHAT GENERAL GUIDELINES DO I NEED TO FOLLOW? °· Slowly drink 1 cup (8 oz) of fluid for each episode of diarrhea. If you are getting enough fluid, your urine will be clear or pale yellow. °· Eat starchy foods. Some good choices include white rice, white toast, pasta, low-fiber cereal, baked potatoes (without the skin), saltine crackers, and bagels. °· Avoid large servings of any cooked vegetables. °· Limit fruit to two servings per day. A serving is ½ cup or 1 small piece. °· Choose foods with less than 2 g of fiber per serving. °· Limit fats to less than 8 tsp (38 g) per day. °· Avoid fried foods. °· Eat foods that have probiotics in them. Probiotics can be found in certain dairy products. °· Avoid foods and beverages that may increase the speed at which food moves through the stomach and intestines (gastrointestinal tract). Things to avoid include: °· High-fiber foods, such as dried fruit, raw fruits and vegetables, nuts, seeds, and whole grain foods. °· Spicy foods and high-fat foods. °· Foods and beverages sweetened with high-fructose corn syrup, honey, or sugar alcohols such as xylitol, sorbitol, and mannitol. °WHAT FOODS ARE RECOMMENDED? °Grains °White rice. White, French, or pita breads (fresh or toasted), including plain rolls, buns, or bagels. White pasta. Saltine, soda, or graham crackers. Pretzels. Low-fiber cereal. Cooked cereals made with water (such as cornmeal, farina, or cream cereals). Plain muffins. Matzo. Melba toast. Zwieback.  °Vegetables °Potatoes (without the skin). Strained tomato and vegetable juices. Most well-cooked and canned  vegetables without seeds. Tender lettuce. °Fruits °Cooked or canned applesauce, apricots, cherries, fruit cocktail, grapefruit, peaches, pears, or plums. Fresh bananas, apples without skin, cherries, grapes, cantaloupe, grapefruit, peaches, oranges, or plums.  °Meat and Other Protein Products °Baked or boiled chicken. Eggs. Tofu. Fish. Seafood. Smooth peanut butter. Ground or well-cooked tender beef, ham, veal, lamb, pork, or poultry.  °Dairy °Plain yogurt, kefir, and unsweetened liquid yogurt. Lactose-free milk, buttermilk, or soy milk. Plain hard cheese. °Beverages °Sport drinks. Clear broths. Diluted fruit juices (except prune). Regular, caffeine-free sodas such as ginger ale. Water. Decaffeinated teas. Oral rehydration solutions. Sugar-free beverages not sweetened with sugar alcohols. °Other °Bouillon, broth, or soups made from recommended foods.  °The items listed above may not be a complete list of recommended foods or beverages. Contact your dietitian for more options. °WHAT FOODS ARE NOT RECOMMENDED? °Grains °Whole grain, whole wheat, bran, or rye breads, rolls, pastas, crackers, and cereals. Wild or brown rice. Cereals that contain more than 2 g of fiber per serving. Corn tortillas or taco shells. Cooked or dry oatmeal. Granola. Popcorn. °Vegetables °Raw vegetables. Cabbage, broccoli, Brussels sprouts, artichokes, baked beans, beet greens, corn, kale, legumes, peas, sweet potatoes, and yams. Potato skins. Cooked spinach and cabbage. °Fruits °Dried fruit, including raisins and dates. Raw fruits. Stewed or dried prunes. Fresh apples with skin, apricots, mangoes, pears, raspberries, and strawberries.  °Meat and Other Protein Products °Chunky peanut butter. Nuts and seeds. Beans and lentils. Bacon.  °Dairy °High-fat cheeses. Milk, chocolate milk, and beverages made with milk, such as milk shakes. Cream. Ice cream. °Sweets and Desserts °Sweet rolls, doughnuts, and sweet breads.   Pancakes and waffles. °Fats and  Oils °Butter. Cream sauces. Margarine. Salad oils. Plain salad dressings. Olives. Avocados.  °Beverages °Caffeinated beverages (such as coffee, tea, soda, or energy drinks). Alcoholic beverages. Fruit juices with pulp. Prune juice. Soft drinks sweetened with high-fructose corn syrup or sugar alcohols. °Other °Coconut. Hot sauce. Chili powder. Mayonnaise. Gravy. Cream-based or milk-based soups.  °The items listed above may not be a complete list of foods and beverages to avoid. Contact your dietitian for more information. °WHAT SHOULD I DO IF I BECOME DEHYDRATED? °Diarrhea can sometimes lead to dehydration. Signs of dehydration include dark urine and dry mouth and skin. If you think you are dehydrated, you should rehydrate with an oral rehydration solution. These solutions can be purchased at pharmacies, retail stores, or online.  °Drink ½-1 cup (120-240 mL) of oral rehydration solution each time you have an episode of diarrhea. If drinking this amount makes your diarrhea worse, try drinking smaller amounts more often. For example, drink 1-3 tsp (5-15 mL) every 5-10 minutes.  °A general rule for staying hydrated is to drink 1½-2 L of fluid per day. Talk to your health care provider about the specific amount you should be drinking each day. Drink enough fluids to keep your urine clear or pale yellow. °  °This information is not intended to replace advice given to you by your health care provider. Make sure you discuss any questions you have with your health care provider. °  °Document Released: 11/08/2003 Document Revised: 09/08/2014 Document Reviewed: 07/11/2013 °Elsevier Interactive Patient Education ©2016 [employer] ° °Viral Gastroenteritis °Viral gastroenteritis is also known as stomach flu. This condition affects the stomach and intestinal tract. It can cause sudden diarrhea and vomiting. The illness typically lasts 3 to 8 days. Most people develop an immune response that eventually gets rid of the virus.  While this natural response develops, the virus can make you quite ill. °CAUSES  °Many different viruses can cause gastroenteritis, such as rotavirus or noroviruses. You can catch one of these viruses by consuming contaminated food or water. You may also catch a virus by sharing utensils or other personal items with an infected person or by touching a contaminated surface. °SYMPTOMS  °The most common symptoms are diarrhea and vomiting. These problems can cause a severe loss of body fluids (dehydration) and a body salt (electrolyte) imbalance. Other symptoms may include: °· Fever. °· Headache. °· Fatigue. °· Abdominal pain. °DIAGNOSIS  °Your caregiver can usually diagnose viral gastroenteritis based on your symptoms and a physical exam. A stool sample may also be taken to test for the presence of viruses or other infections. °TREATMENT  °This illness typically goes away on its own. Treatments are aimed at rehydration. The most serious cases of viral gastroenteritis involve vomiting so severely that you are not able to keep fluids down. In these cases, fluids must be given through an intravenous line (IV). °HOME CARE INSTRUCTIONS  °· Drink enough fluids to keep your urine clear or pale yellow. Drink small amounts of fluids frequently and increase the amounts as tolerated. °· Ask your caregiver for specific rehydration instructions. °· Avoid: °¨ Foods high in sugar. °¨ Alcohol. °¨ Carbonated drinks. °¨ Tobacco. °¨ Juice. °¨ Caffeine drinks. °¨ Extremely hot or cold fluids. °¨ Fatty, greasy foods. °¨ Too much intake of anything at one time. °¨ Dairy products until 24 to 48 hours after diarrhea stops. °· You may consume probiotics. Probiotics are active cultures of beneficial bacteria. They may lessen the amount and   number of diarrheal stools in adults. Probiotics can be found in yogurt with active cultures and in supplements. °· Wash your hands well to avoid spreading the virus. °· Only take over-the-counter or  prescription medicines for pain, discomfort, or fever as directed by your caregiver. Do not give aspirin to children. Antidiarrheal medicines are not recommended. °· Ask your caregiver if you should continue to take your regular prescribed and over-the-counter medicines. °· Keep all follow-up appointments as directed by your caregiver. °SEEK IMMEDIATE MEDICAL CARE IF:  °· You are unable to keep fluids down. °· You do not urinate at least once every 6 to 8 hours. °· You develop shortness of breath. °· You notice blood in your stool or vomit. This may look like coffee grounds. °· You have abdominal pain that increases or is concentrated in one small area (localized). °· You have persistent vomiting or diarrhea. °· You have a fever. °· The patient is a child younger than 3 months, and he or she has a fever. °· The patient is a child older than 3 months, and he or she has a fever and persistent symptoms. °· The patient is a child older than 3 months, and he or she has a fever and symptoms suddenly get worse. °· The patient is a baby, and he or she has no tears when crying. °MAKE SURE YOU:  °· Understand these instructions. °· Will watch your condition. °· Will get help right away if you are not doing well or get worse. °  °This information is not intended to replace advice given to you by your health care provider. Make sure you discuss any questions you have with your health care provider. °  °Document Released: 08/18/2005 Document Revised: 11/10/2011 Document Reviewed: 06/04/2011 °Elsevier Interactive Patient Education ©2016 [employer] ° °

## 2016-02-07 ENCOUNTER — Encounter (HOSPITAL_COMMUNITY): Payer: Self-pay

## 2016-02-07 ENCOUNTER — Inpatient Hospital Stay (HOSPITAL_COMMUNITY)
Admission: AD | Admit: 2016-02-07 | Discharge: 2016-02-07 | Disposition: A | Discharge status: Home / Self Care | Payer: 59 | Source: Ambulatory Visit | Attending: Obstetrics & Gynecology | Admitting: Obstetrics & Gynecology

## 2016-02-07 DIAGNOSIS — M5136 Other intervertebral disc degeneration, lumbar region: Secondary | ICD-10-CM | POA: Diagnosis not present

## 2016-02-07 DIAGNOSIS — O219 Vomiting of pregnancy, unspecified: Secondary | ICD-10-CM

## 2016-02-07 DIAGNOSIS — O212 Late vomiting of pregnancy: Secondary | ICD-10-CM | POA: Diagnosis not present

## 2016-02-07 DIAGNOSIS — Z3A30 30 weeks gestation of pregnancy: Secondary | ICD-10-CM | POA: Insufficient documentation

## 2016-02-07 DIAGNOSIS — R42 Dizziness and giddiness: Secondary | ICD-10-CM | POA: Diagnosis present

## 2016-02-07 DIAGNOSIS — R112 Nausea with vomiting, unspecified: Secondary | ICD-10-CM | POA: Diagnosis not present

## 2016-02-07 DIAGNOSIS — R55 Syncope and collapse: Secondary | ICD-10-CM

## 2016-02-07 DIAGNOSIS — O26893 Other specified pregnancy related conditions, third trimester: Secondary | ICD-10-CM | POA: Diagnosis not present

## 2016-02-07 DIAGNOSIS — Z981 Arthrodesis status: Secondary | ICD-10-CM | POA: Insufficient documentation

## 2016-02-07 LAB — COMPREHENSIVE METABOLIC PANEL
ALBUMIN: 3.3 g/dL — AB (ref 3.5–5.0)
ALK PHOS: 127 U/L — AB (ref 38–126)
ALT: 15 U/L (ref 14–54)
ANION GAP: 8 (ref 5–15)
AST: 16 U/L (ref 15–41)
BUN: 6 mg/dL (ref 6–20)
CO2: 21 mmol/L — AB (ref 22–32)
Calcium: 8.4 mg/dL — ABNORMAL LOW (ref 8.9–10.3)
Chloride: 103 mmol/L (ref 101–111)
Creatinine, Ser: 0.64 mg/dL (ref 0.44–1.00)
GFR calc Af Amer: >60 mL/min (ref >60)
GFR calc non Af Amer: >60 mL/min (ref >60)
GLUCOSE: 76 mg/dL (ref 65–99)
POTASSIUM: 3.6 mmol/L (ref 3.5–5.1)
SODIUM: 132 mmol/L — AB (ref 135–145)
Total Bilirubin: 0.9 mg/dL (ref 0.3–1.2)
Total Protein: 7.1 g/dL (ref 6.5–8.1)

## 2016-02-07 LAB — URINALYSIS, ROUTINE W REFLEX MICROSCOPIC
Bilirubin Urine: NEGATIVE
GLUCOSE, UA: NEGATIVE mg/dL
Hgb urine dipstick: NEGATIVE
KETONES UR: NEGATIVE mg/dL
NITRITE: NEGATIVE
PH: 6.5 (ref 5.0–8.0)
Protein, ur: NEGATIVE mg/dL
Specific Gravity, Urine: 1.01 (ref 1.005–1.030)

## 2016-02-07 LAB — CBC
HEMATOCRIT: 34.2 % — AB (ref 36.0–46.0)
HEMOGLOBIN: 11.8 g/dL — AB (ref 12.0–15.0)
MCH: 30.3 pg (ref 26.0–34.0)
MCHC: 34.5 g/dL (ref 30.0–36.0)
MCV: 87.7 fL (ref 78.0–100.0)
Platelets: 221 10*3/uL (ref 150–400)
RBC: 3.9 MIL/uL (ref 3.87–5.11)
RDW: 13.3 % (ref 11.5–15.5)
WBC: 10.6 10*3/uL — ABNORMAL HIGH (ref 4.0–10.5)

## 2016-02-07 LAB — URINE MICROSCOPIC-ADD ON: RBC / HPF: NONE SEEN RBC/hpf (ref 0–5)

## 2016-02-07 LAB — GLUCOSE, CAPILLARY: Glucose-Capillary: 71 mg/dL (ref 65–99)

## 2016-02-07 MED ORDER — PROMETHAZINE HCL 25 MG PO TABS: 25.0000 mg | ORAL_TABLET | Freq: Four times a day (QID) | ORAL | Status: DC | PRN

## 2016-02-07 MED ORDER — DEXTROSE IN LACTATED RINGERS 5 % IV SOLN
25.0000 mg | Freq: Once | INTRAVENOUS | Status: AC
  Administered 2016-02-07: 25 mg via INTRAVENOUS
  Filled 2016-02-07: qty 1

## 2016-02-07 NOTE — Discharge Instructions (Signed)
Near-Syncope Near-syncope (commonly known as near fainting) is sudden weakness, dizziness, or feeling like you might pass out. During an episode of near-syncope, you may also develop pale skin, have tunnel vision, or feel sick to your stomach (nauseous). Near-syncope may occur when getting up after sitting or while standing for a long time. It is caused by a sudden decrease in blood flow to the brain. This decrease can result from various causes or triggers, most of which are not serious. However, because near-syncope can sometimes be a sign of something serious, a medical evaluation is required. The specific cause is often not determined. HOME CARE INSTRUCTIONS  Monitor your condition for any changes. The following actions may help to alleviate any discomfort you are experiencing:  Have someone stay with you until you feel stable.  Lie down right away and prop your feet up if you start feeling like you might faint. Breathe deeply and steadily. Wait until all the symptoms have passed. Most of these episodes last only a few minutes. You may feel tired for several hours.   Drink enough fluids to keep your urine clear or pale yellow.   If you are taking blood pressure or heart medicine, get up slowly when seated or lying down. Take several minutes to sit and then stand. This can reduce dizziness.  Follow up with your health care provider as directed. SEEK IMMEDIATE MEDICAL CARE IF:   You have a severe headache.   You have unusual pain in the chest, abdomen, or back.   You are bleeding from the mouth or rectum, or you have black or tarry stool.   You have an irregular or very fast heartbeat.   You have repeated fainting or have seizure-like jerking during an episode.   You faint when sitting or lying down.   You have confusion.   You have difficulty walking.   You have severe weakness.   You have vision problems.  MAKE SURE YOU:   Understand these instructions.  Will  watch your condition.  Will get help right away if you are not doing well or get worse.   This information is not intended to replace advice given to you by your health care provider. Make sure you discuss any questions you have with your health care provider.   Document Released: 08/18/2005 Document Revised: 08/23/2013 Document Reviewed: 01/21/2013 Elsevier Interactive Patient Education 2016 Elsevier Inc.  Nonspecific Tachycardia Tachycardia is a faster than normal heartbeat (more than 100 beats per minute). In adults, the heart normally beats between 60 and 100 times a minute. A fast heartbeat may be a normal response to exercise or stress. It does not necessarily mean that something is wrong. However, sometimes when your heart beats too fast it may not be able to pump enough blood to the rest of your body. This can result in chest pain, shortness of breath, dizziness, and even fainting. Nonspecific tachycardia means that the specific cause or pattern of your tachycardia is unknown. CAUSES  Tachycardia may be harmless or it may be due to a more serious underlying cause. Possible causes of tachycardia include:  Exercise or exertion.  Fever.  Pain or injury.  Infection.  Loss of body fluids (dehydration).  Overactive thyroid.  Lack of red blood cells (anemia).  Anxiety and stress.  Alcohol.  Caffeine.  Tobacco products.  Diet pills.  Illegal drugs.  Heart disease. SYMPTOMS  Rapid or irregular heartbeat (palpitations).  Suddenly feeling your heart beating (cardiac awareness).  Dizziness.  Tiredness (  fatigue).  Shortness of breath.  Chest pain.  Nausea.  Fainting. DIAGNOSIS  Your caregiver will perform a physical exam and take your medical history. In some cases, a heart specialist (cardiologist) may be consulted. Your caregiver may also order:  Blood tests.  Electrocardiography. This test records the electrical activity of your heart.  A heart  monitoring test. TREATMENT  Treatment will depend on the likely cause of your tachycardia. The goal is to treat the underlying cause of your tachycardia. Treatment methods may include:  Replacement of fluids or blood through an intravenous (IV) tube for moderate to severe dehydration or anemia.  New medicines or changes in your current medicines.  Diet and lifestyle changes.  Treatment for certain infections.  Stress relief or relaxation methods. HOME CARE INSTRUCTIONS   Rest.  Drink enough fluids to keep your urine clear or pale yellow.  Do not smoke.  Avoid:  Caffeine.  Tobacco.  Alcohol.  Chocolate.  Stimulants such as over-the-counter diet pills or pills that help you stay awake.  Situations that cause anxiety or stress.  Illegal drugs such as marijuana, phencyclidine (PCP), and cocaine.  Only take medicine as directed by your caregiver.  Keep all follow-up appointments as directed by your caregiver. SEEK IMMEDIATE MEDICAL CARE IF:   You have pain in your chest, upper arms, jaw, or neck.  You become weak, dizzy, or feel faint.  You have palpitations that will not go away.  You vomit, have diarrhea, or pass blood in your stool.  Your skin is cool, pale, and wet.  You have a fever that will not go away with rest, fluids, and medicine. MAKE SURE YOU:   Understand these instructions.  Will watch your condition.  Will get help right away if you are not doing well or get worse.   This information is not intended to replace advice given to you by your health care provider. Make sure you discuss any questions you have with your health care provider.   Document Released: 09/25/2004 Document Revised: 11/10/2011 Document Reviewed: 03/02/2015 Elsevier Interactive Patient Education 2016 Elsevier Inc.  Sertraline tablets What is this medicine? SERTRALINE (SER tra leen) is used to treat depression. It may also be used to treat obsessive compulsive disorder,  panic disorder, post-trauma stress, premenstrual dysphoric disorder (PMDD) or social anxiety. This medicine may be used for other purposes; ask your health care provider or pharmacist if you have questions. What should I tell my health care provider before I take this medicine? They need to know if you have any of these conditions: -bipolar disorder or a family history of bipolar disorder -diabetes -glaucoma -heart disease -high blood pressure -history of irregular heartbeat -history of low levels of calcium, magnesium, or potassium in the blood -if you often drink alcohol -liver disease -receiving electroconvulsive therapy -seizures -suicidal thoughts, plans, or attempt; a previous suicide attempt by you or a family member -thyroid disease -an unusual or allergic reaction to sertraline, other medicines, foods, dyes, or preservatives -pregnant or trying to get pregnant -breast-feeding How should I use this medicine? Take this medicine by mouth with a glass of water. Follow the directions on the prescription label. You can take it with or without food. Take your medicine at regular intervals. Do not take your medicine more often than directed. Do not stop taking this medicine suddenly except upon the advice of your doctor. Stopping this medicine too quickly may cause serious side effects or your condition may worsen. A special MedGuide will be  given to you by the pharmacist with each prescription and refill. Be sure to read this information carefully each time. Talk to your pediatrician regarding the use of this medicine in children. While this drug may be prescribed for children as young as 7 years for selected conditions, precautions do apply. Overdosage: If you think you have taken too much of this medicine contact a poison control center or emergency room at once. NOTE: This medicine is only for you. Do not share this medicine with others. What if I miss a dose? If you miss a dose, take  it as soon as you can. If it is almost time for your next dose, take only that dose. Do not take double or extra doses. What may interact with this medicine? Do not take this medicine with any of the following medications: -certain medicines for fungal infections like fluconazole, itraconazole, ketoconazole, posaconazole, voriconazole -cisapride -disulfiram -dofetilide -linezolid -MAOIs like Carbex, Eldepryl, Marplan, Nardil, and Parnate -metronidazole -methylene blue (injected into a vein) -pimozide -thioridazine -ziprasidone This medicine may also interact with the following medications: -alcohol -aspirin and aspirin-like medicines -certain medicines for depression, anxiety, or psychotic disturbances -certain medicines for irregular heart beat like flecainide, propafenone -certain medicines for migraine headaches like almotriptan, eletriptan, frovatriptan, naratriptan, rizatriptan, sumatriptan, zolmitriptan -certain medicines for sleep -certain medicines for seizures like carbamazepine, valproic acid, phenytoin -certain medicines that treat or prevent blood clots like warfarin, enoxaparin, dalteparin -cimetidine -digoxin -diuretics -fentanyl -furazolidone -isoniazid -lithium -NSAIDs, medicines for pain and inflammation, like ibuprofen or naproxen -other medicines that prolong the QT interval (cause an abnormal heart rhythm) -procarbazine -rasagiline -supplements like St. John's wort, kava kava, valerian -tolbutamide -tramadol -tryptophan This list may not describe all possible interactions. Give your health care provider a list of all the medicines, herbs, non-prescription drugs, or dietary supplements you use. Also tell them if you smoke, drink alcohol, or use illegal drugs. Some items may interact with your medicine. What should I watch for while using this medicine? Tell your doctor if your symptoms do not get better or if they get worse. Visit your doctor or health care  professional for regular checks on your progress. Because it may take several weeks to see the full effects of this medicine, it is important to continue your treatment as prescribed by your doctor. Patients and their families should watch out for new or worsening thoughts of suicide or depression. Also watch out for sudden changes in feelings such as feeling anxious, agitated, panicky, irritable, hostile, aggressive, impulsive, severely restless, overly excited and hyperactive, or not being able to sleep. If this happens, especially at the beginning of treatment or after a change in dose, call your health care professional. Bonita QuinYou may get drowsy or dizzy. Do not drive, use machinery, or do anything that needs mental alertness until you know how this medicine affects you. Do not stand or sit up quickly, especially if you are an older patient. This reduces the risk of dizzy or fainting spells. Alcohol may interfere with the effect of this medicine. Avoid alcoholic drinks. Your mouth may get dry. Chewing sugarless gum or sucking hard candy, and drinking plenty of water may help. Contact your doctor if the problem does not go away or is severe. What side effects may I notice from receiving this medicine? Side effects that you should report to your doctor or health care professional as soon as possible: -allergic reactions like skin rash, itching or hives, swelling of the face, lips, or tongue -black or  bloody stools, blood in the urine or vomit -fast, irregular heartbeat -feeling faint or lightheaded, falls -hallucination, loss of contact with reality -seizures -suicidal thoughts or other mood changes -unusual bleeding or bruising -unusually weak or tired -vomiting Side effects that usually do not require medical attention (report to your doctor or health care professional if they continue or are bothersome): -change in appetite -change in sex drive or performance -diarrhea -increased  sweating -indigestion, nausea -tremors This list may not describe all possible side effects. Call your doctor for medical advice about side effects. You may report side effects to FDA at 1-800-FDA-1088. Where should I keep my medicine? Keep out of the reach of children. Store at room temperature between 15 and 30 degrees C (59 and 86 degrees F). Throw away any unused medicine after the expiration date. NOTE: This sheet is a summary. It may not cover all possible information. If you have questions about this medicine, talk to your doctor, pharmacist, or health care provider.    2016, Elsevier/Gold Standard. (2013-03-15 12:57:35)

## 2016-02-07 NOTE — MAU Provider Note (Signed)
Chief Complaint:  Dizziness   First Provider Initiated Contact with Patient 02/07/16 1220      HPI: Maureen Nelson is a 30 y.o. G2P1001 at [redacted]w[redacted]d who presents to maternity admissions reporting continued dizziness, feeling off-balance and near-syncope today. Sx started 02/04/16 when pt had a ~ minute syncopal episode at home. Was seen at Ob/Gyn office the next day. MD felt that Sx were from anxiety (pt has Hx of panic attacks many years ago) and started on Zoloft. Pt does state that she's been under a lot of stress lately. No Hx cardiac or neuro problems.   Pt has had N/V throughout the pregnancy Tx'd w/ Diclegis and Zofran. Took meds today including 8 mg Zofran. Still having a lot of nausea, poor PO intake.   Dizziness Duration: 3 days Context: Poor PO intake, stress Timing: intermittent Modifying factors: Partial improvement w/ drinking water.  Associated signs and symptoms: Pos for N/V, "feeling heart beat out of chest". Neg for fever, chills, HA, vision changes, chest pain, SOB, palpitations.  Denies contractions, leakage of fluid or vaginal bleeding. Good fetal movement.   Past Medical History: Past Medical History  Diagnosis Date  . Complication of anesthesia     Difficulty "waking up"  . Degenerative disc disease, lumbar     Past obstetric history: OB History  Gravida Para Term Preterm AB SAB TAB Ectopic Multiple Living  # Outcome Date GA Lbr Len/2nd Weight Sex Delivery Anes PTL Lv  2 Current           1 Term 11/28/10    F Vag-Spont   Y      Past Surgical History: Past Surgical History  Procedure Laterality Date  . Spinal fusion    . Cholecystectomy    . Breast lumpectomy    . Back surgery    . Breast lumpectomy       Family History: Family History  Problem Relation Age of Onset  . Heart disease Maternal Grandmother   . Cancer Maternal Grandfather   . Cancer Paternal Grandfather     Social History: Social History  Substance Use Topics  .  Smoking status: Never Smoker   . Smokeless tobacco: Never Used  . Alcohol Use: No     Comment: Occasional    Allergies:  Allergies  Allergen Reactions  . Dicyclomine Hcl Rash    Meds:  Prescriptions prior to admission  Medication Sig Dispense Refill Last Dose  . acetaminophen (TYLENOL) 500 MG tablet Take 1,000 mg by mouth every 6 (six) hours as needed for mild pain or headache.   02/06/2016 at Unknown time  . Doxylamine-Pyridoxine (DICLEGIS) 10-10 MG TBEC Take 2 tablets by mouth at bedtime.   02/06/2016 at Unknown time  . famotidine-calcium carbonate-magnesium hydroxide (PEPCID COMPLETE) 10-800-165 MG chewable tablet Chew 1 tablet by mouth daily as needed (heartburn).   02/06/2016 at Unknown time  . ondansetron (ZOFRAN ODT) 8 MG disintegrating tablet Take 1 tablet (8 mg total) by mouth every 8 (eight) hours as needed for nausea or vomiting. 20 tablet 0 02/07/2016 at Unknown time  . Prenatal Vit-Fe Fumarate-FA (MULTIVITAMIN-PRENATAL) 27-0.8 MG TABS tablet Take 1 tablet by mouth at bedtime.    02/06/2016 at Unknown time  . sertraline (ZOLOFT) 25 MG tablet Take 25 mg by mouth daily.   02/06/2016 at Unknown time  . cyclobenzaprine (FLEXERIL) 10 MG tablet Take 1 tablet (10 mg total) by mouth 2 (two)  times daily as needed for muscle spasms. (Patient not taking: Reported on 02/07/2016) 20 tablet 0 Not Taking at Unknown time    I have reviewed patient's Past Medical Hx, Surgical Hx, Family Hx, Social Hx, medications and allergies.   ROS:  Review of Systems  Constitutional: Negative for fever and chills.  Eyes: Negative for visual disturbance.  Respiratory: Negative for chest tightness and shortness of breath.   Cardiovascular: Negative for chest pain, palpitations and leg swelling.  Gastrointestinal: Positive for nausea and vomiting. Negative for abdominal pain, diarrhea and constipation.  Genitourinary: Negative for vaginal bleeding.  Skin: Negative for pallor.  Neurological: Positive for dizziness and  syncope. Negative for seizures, facial asymmetry, speech difficulty, weakness, light-headedness, numbness and headaches.  Psychiatric/Behavioral: The patient is nervous/anxious.     Physical Exam   Patient Vitals for the past 24 hrs:  BP Temp Temp src Pulse Resp SpO2 Height Weight  02/07/16 1150 - - - - 18 - - -  02/07/16 1146 - - - - - 97 % - -  02/07/16 1123 117/68 mmHg 98 F (36.7 C) Oral 100 18 - 5\' 7"  (1.702 m) 207 lb (93.895 kg)   Orthostatic VS for the past 24 hrs (Last 3 readings):  BP- Lying Pulse- Lying BP- Sitting Pulse- Sitting BP- Standing at 0 minutes Pulse- Standing at 0 minutes BP- Standing at 3 minutes Pulse- Standing at 3 minutes  02/07/16 1146 114/72 mmHg 91 114/71 mmHg 86 122/85 mmHg 96 119/81 mmHg 91   Constitutional: Well-developed, well-nourished female in no acute distress. No Pallor. Cardiovascular: normal rate and rhythm Respiratory: normal effort. CTAB GI: Abd soft, non-tender, gravid appropriate for gestational age.  Neurologic: Alert and oriented x 4. Grossly non-focal. Normal speech and gait.  GU: Deferred    FHT:  Baseline 135 , moderate variability, accelerations present, no decelerations Contractions: UI   Labs: Results for orders placed or performed during the hospital encounter of 02/07/16 (from the past 24 hour(s))  Urinalysis, Routine w reflex microscopic (not at [facility])     Status: Abnormal   Collection Time: 02/07/16 11:29 AM  Result Value Ref Range   Color, Urine YELLOW YELLOW   APPearance CLEAR CLEAR   Specific Gravity, Urine 1.010 1.005 - 1.030   pH 6.5 5.0 - 8.0   Glucose, UA NEGATIVE NEGATIVE mg/dL   Hgb urine dipstick NEGATIVE NEGATIVE   Bilirubin Urine NEGATIVE NEGATIVE   Ketones, ur NEGATIVE NEGATIVE mg/dL   Protein, ur NEGATIVE NEGATIVE mg/dL   Nitrite NEGATIVE NEGATIVE   Leukocytes, UA TRACE (A) NEGATIVE  Urine microscopic-add on     Status: Abnormal   Collection Time: 02/07/16 11:29 AM  Result Value Ref Range   Squamous  Epithelial / LPF 0-5 (A) NONE SEEN   WBC, UA 0-5 0 - 5 WBC/hpf   RBC / HPF NONE SEEN 0 - 5 RBC/hpf   Bacteria, UA RARE (A) NONE SEEN  CBC     Status: Abnormal   Collection Time: 02/07/16 11:38 AM  Result Value Ref Range   WBC 10.6 (H) 4.0 - 10.5 K/uL   RBC 3.90 3.87 - 5.11 MIL/uL   Hemoglobin 11.8 (L) 12.0 - 15.0 g/dL   HCT 29.5 (L) 62.1 - 30.8 %   MCV 87.7 78.0 - 100.0 fL   MCH 30.3 26.0 - 34.0 pg   MCHC 34.5 30.0 - 36.0 g/dL   RDW 65.7 84.6 - 96.2 %   Platelets 221 150 - 400 K/uL  Comprehensive metabolic panel  Status: Abnormal   Collection Time: 02/07/16 11:38 AM  Result Value Ref Range   Sodium 132 (L) 135 - 145 mmol/L   Potassium 3.6 3.5 - 5.1 mmol/L   Chloride 103 101 - 111 mmol/L   CO2 21 (L) 22 - 32 mmol/L   Glucose, Bld 76 65 - 99 mg/dL   BUN 6 6 - 20 mg/dL   Creatinine, Ser 5.280.64 0.44 - 1.00 mg/dL   Calcium 8.4 (L) 8.9 - 10.3 mg/dL   Total Protein 7.1 6.5 - 8.1 g/dL   Albumin 3.3 (L) 3.5 - 5.0 g/dL   AST 16 15 - 41 U/L   ALT 15 14 - 54 U/L   Alkaline Phosphatase 127 (H) 38 - 126 U/L   Total Bilirubin 0.9 0.3 - 1.2 mg/dL   GFR calc non Af Amer >60 >60 mL/min   GFR calc Af Amer >60 >60 mL/min   Anion gap 8 5 - 15  Glucose, capillary     Status: None   Collection Time: 02/07/16 11:49 AM  Result Value Ref Range   Glucose-Capillary 71 65 - 99 mg/dL    Imaging:  EKG NSR  MAU Course: UA, CBC, CMET, CBG, D5LR bolus w/ phenergan, EKG.  Nausea improved. Tolerating POs. Still having mild dizziness, but ambulating safely.   Discussed history, exam, labs with Dr. Idamae SchullerVarnardo. Agrees with plan of care.  MDM: - 30 year old female 30 weeks 4 days gestation with three-day history of syncopal and near syncopal episodes 2, dizziness likely due to combination of poor PO intake, anxiety and possible tachycardic episodes. Improved with IV fluids and Phenergan. Also suspect that initiation of Zoloft 3 days ago may be contributing to some of her symptoms. Explained to patient  what to expect when initiating Zoloft and that it may take 4-6 weeks for medication to take full effect.  Assessment: 1. Near syncope   2. Nausea and vomiting of pregnancy, antepartum     Plan: Discharge home in stable condition Per consult with Dr. Idamae Schullervarnardo.  Syncope precautions. Discussed checking heart rate. If tachycardic episodes recur patient may need outpatient cardiology appointment. Encouraged small frequent snacks Preterm Labor precautions and fetal kick counts Rx Phenergan.     Follow-up Information    Follow up with Myna HidalgoZAN, JENNIFER, M, DO In 1 week.   Specialty:  Obstetrics and Gynecology   Why:  or sooner as needed if symptoms worsen   Contact information:   [address] [place] [address] [phone]       Follow [facility] with [facility].   Why:  As needed in emergencies   Contact information:   [address] [id number] [place] [id number] [phone]        Medication List    STOP taking these medications        cyclobenzaprine 10 MG tablet  Commonly known as:  FLEXERIL      TAKE these medications        acetaminophen 500 MG tablet  Commonly known as:  TYLENOL  Take 1,000 mg by mouth every 6 (six) hours as needed for mild pain or headache.     DICLEGIS 10-10 MG Tbec  Generic drug:  Doxylamine-Pyridoxine  Take 2 tablets by mouth at bedtime.     famotidine-calcium carbonate-magnesium hydroxide 10-800-165 MG chewable tablet  Commonly known as:  PEPCID COMPLETE  Chew 1 tablet by mouth daily as needed (heartburn).     multivitamin-prenatal 27-0.8 MG  Tabs tablet  Take 1 tablet by mouth at bedtime.     ondansetron 8 MG disintegrating tablet  Commonly known as:  ZOFRAN ODT  Take 1 tablet (8 mg total) by mouth every 8 (eight) hours as needed for nausea or vomiting.     promethazine 25 MG tablet  Commonly known as:  PHENERGAN  Take 1 tablet (25 mg total) by mouth  every 6 (six) hours as needed.     sertraline 25 MG tablet  Commonly known as:  ZOLOFT  Take 25 mg by mouth daily.        [place], [place] 02/07/2016 3:14 PM

## 2016-02-07 NOTE — MAU Note (Signed)
Pt stated she feels she has been feeling dizzy and unbalanced. Stated she is seeing some floaters Reprots she passed out on Monday and saw her doctor on Tuesday and was given Zoloft thinking it was anxiety that was cuaing her symptoms.

## 2016-03-05 ENCOUNTER — Telehealth: Payer: Self-pay | Admitting: Obstetrics and Gynecology

## 2016-03-05 NOTE — Telephone Encounter (Signed)
TC from patient--[facility], Dr. Charlotta Newtonzan, 34 3/7 weeks, "bad back pain" and some UCs.  Tried Tylenol, heat, rest, without benefit.  No dysuria.  Recommended MAU evaluation.

## 2016-03-06 ENCOUNTER — Inpatient Hospital Stay (HOSPITAL_COMMUNITY)
Admission: AD | Admit: 2016-03-06 | Discharge: 2016-03-06 | Disposition: A | Discharge status: Home / Self Care | Payer: 59 | Source: Ambulatory Visit | Attending: Obstetrics and Gynecology | Admitting: Obstetrics and Gynecology

## 2016-03-06 ENCOUNTER — Encounter (HOSPITAL_COMMUNITY): Payer: Self-pay | Admitting: *Deleted

## 2016-03-06 DIAGNOSIS — O99891 Other specified diseases and conditions complicating pregnancy: Secondary | ICD-10-CM

## 2016-03-06 DIAGNOSIS — Z3A34 34 weeks gestation of pregnancy: Secondary | ICD-10-CM | POA: Insufficient documentation

## 2016-03-06 DIAGNOSIS — Z79899 Other long term (current) drug therapy: Secondary | ICD-10-CM | POA: Insufficient documentation

## 2016-03-06 DIAGNOSIS — O479 False labor, unspecified: Secondary | ICD-10-CM

## 2016-03-06 DIAGNOSIS — O9989 Other specified diseases and conditions complicating pregnancy, childbirth and the puerperium: Secondary | ICD-10-CM

## 2016-03-06 DIAGNOSIS — O4703 False labor before 37 completed weeks of gestation, third trimester: Secondary | ICD-10-CM | POA: Diagnosis not present

## 2016-03-06 DIAGNOSIS — Z888 Allergy status to other drugs, medicaments and biological substances status: Secondary | ICD-10-CM | POA: Insufficient documentation

## 2016-03-06 DIAGNOSIS — M549 Dorsalgia, unspecified: Secondary | ICD-10-CM

## 2016-03-06 DIAGNOSIS — M545 Low back pain: Secondary | ICD-10-CM | POA: Diagnosis present

## 2016-03-06 LAB — URINALYSIS, ROUTINE W REFLEX MICROSCOPIC
BILIRUBIN URINE: NEGATIVE
GLUCOSE, UA: NEGATIVE mg/dL
HGB URINE DIPSTICK: NEGATIVE
KETONES UR: NEGATIVE mg/dL
NITRITE: NEGATIVE
PROTEIN: NEGATIVE mg/dL
Specific Gravity, Urine: 1.03 (ref 1.005–1.030)
pH: 6 (ref 5.0–8.0)

## 2016-03-06 LAB — URINE MICROSCOPIC-ADD ON

## 2016-03-06 MED ORDER — OXYCODONE-ACETAMINOPHEN 5-325 MG PO TABS
2.0000 | ORAL_TABLET | Freq: Once | ORAL | Status: AC
Start: 2016-03-06 — End: 2016-03-06
  Administered 2016-03-06: 2 via ORAL
  Filled 2016-03-06: qty 2

## 2016-03-06 MED ORDER — LACTATED RINGERS IV BOLUS (SEPSIS)
1000.0000 mL | Freq: Once | INTRAVENOUS | Status: AC
  Administered 2016-03-06: 1000 mL via INTRAVENOUS

## 2016-03-06 NOTE — Discharge Instructions (Signed)

## 2016-03-06 NOTE — MAU Provider Note (Signed)
History     CSN: [id number]  Arrival date and time: 03/06/16 0003   First Provider Initiated Contact with Patient 03/06/16 0045      Chief Complaint  Patient presents with  . Back Pain   HPI Comments: Maureen Nelson is a 30 y.o. G2P1001 at 3563w4d who presents today with back pain and pelvic pressure. She denies any complications with this pregnancy or her last. She denies any VB or LOF. She states that the fetus has been moving normally. She states that she was checked in the office recently and she was closed.   Back Pain This is a new problem. The current episode started 1 to 4 weeks ago (but worse since about 1700 today ). The problem occurs intermittently. The problem has been gradually worsening since onset. The pain is present in the lumbar spine. The quality of the pain is described as cramping. Radiates to: into pelvis with a lot of pelvic pressure.  The pain is at a severity of 5/10. Pertinent negatives include no abdominal pain, dysuria or fever. She has tried walking and bed rest (hot bath) for the symptoms. The treatment provided no relief.     Past Medical History  Diagnosis Date  . Complication of anesthesia     Difficulty "waking up"  . Degenerative disc disease, lumbar     Past Surgical History  Procedure Laterality Date  . Spinal fusion    . Cholecystectomy    . Breast lumpectomy    . Back surgery    . Breast lumpectomy      Family History  Problem Relation Age of Onset  . Heart disease Maternal Grandmother   . Cancer Maternal Grandfather   . Cancer Paternal Grandfather     Social History  Substance Use Topics  . Smoking status: Never Smoker   . Smokeless tobacco: Never Used  . Alcohol Use: No     Comment: Occasional    Allergies:  Allergies  Allergen Reactions  . Dicyclomine Hcl Rash    Prescriptions prior to admission  Medication Sig Dispense Refill Last Dose  . acetaminophen (TYLENOL) 500 MG tablet Take 1,000 mg by mouth every 6 (six)  hours as needed for mild pain or headache.   03/06/2016 at 0800  . Doxylamine-Pyridoxine (DICLEGIS) 10-10 MG TBEC Take 2 tablets by mouth at bedtime.   03/06/2016 at Unknown time  . famotidine-calcium carbonate-magnesium hydroxide (PEPCID COMPLETE) 10-800-165 MG chewable tablet Chew 1 tablet by mouth daily as needed (heartburn).   03/06/2016 at Unknown time  . ondansetron (ZOFRAN ODT) 8 MG disintegrating tablet Take 1 tablet (8 mg total) by mouth every 8 (eight) hours as needed for nausea or vomiting. 20 tablet 0 03/06/2016 at Unknown time  . Prenatal Vit-Fe Fumarate-FA (MULTIVITAMIN-PRENATAL) 27-0.8 MG TABS tablet Take 1 tablet by mouth at bedtime.    03/06/2016 at Unknown time  . sertraline (ZOLOFT) 25 MG tablet Take 25 mg by mouth daily.   03/06/2016 at Unknown time  . promethazine (PHENERGAN) 25 MG tablet Take 1 tablet (25 mg total) by mouth every 6 (six) hours as needed. 30 tablet 1     Review of Systems  Constitutional: Negative for fever and chills.  Gastrointestinal: Positive for nausea and diarrhea. Negative for vomiting, abdominal pain and constipation.  Genitourinary: Negative for dysuria, urgency and frequency.  Musculoskeletal: Positive for back pain.   Physical Exam   Blood pressure 115/68, pulse 102, temperature 97.5 F (36.4 C), resp. rate 18, height 5\' 5"  (1.651  m), weight 98.09 kg (216 lb 4 oz), last menstrual period 06/17/2015.  Physical Exam  Nursing note and vitals reviewed. Constitutional: She is oriented to person, place, and time. She appears well-developed and well-nourished.  Appears uncomfortable with contractions.   Cardiovascular: Normal rate.   Respiratory: Effort normal.  GI: Soft. There is no tenderness.  Genitourinary:  Cervix: 1/thick/-3   Neurological: She is alert and oriented to person, place, and time.  Skin: Skin is warm and dry.  Psychiatric: She has a normal mood and affect.   FHT: 135, moderate with 15x15 accels, no decels Toco: q 3-5 min contractions.   MAU Course  Procedures  MDM 0101: D/W Dr. Su Hiltoberts. Will hydrate and treat pain. If more comfortable and no change then patient can be DC home. If she is not any more comfortable or makes any change call Dr. Su Hiltoberts with update.  0118: Patient declines GBS testing today. She states that she had a urine culture with +GBS in March, and she already knows that she will need antibiotics.  0214: Patient has had 1L of LR and 2 percocet. She reports that her pain has improved. No longer tracing any contractions on the monitor.  [id number]: No cervical change  Assessment and Plan   1. Braxton Hicks contractions   2. Back pain affecting pregnancy in third trimester   3. [redacted] weeks gestation of pregnancy    DC home Comfort measures reviewed  3rd Trimester precautions  PTL precautions  Fetal kick counts RX: none  Return to MAU as needed FU with OB as planned  Follow-up Information    Follow up with Jessee AversOLE,TARA J., MD.   Specialty:  Obstetrics and Gynecology   Why:  As scheduled   Contact information:   [address] [place] [id number] 225 [phone]         Tawnya CrookHogan, [place] Milnes Donovan 03/06/2016, 12:48 AM

## 2016-03-06 NOTE — MAU Note (Signed)
PT SAYS  FEELS PRESSURE IN HER BACK AND PELVIS    -    TOOK TYLENOL AT 8PM-  2 TAB  XS-  NO RELIEF.    HAS BEEN HAVING  THIS  PAIN - OFF/ ON   FOR  A WHILE-  PT  CANNOT  GIVE  A  DAY  WHEN  STARTED -   HAS  TOLD  DR- WAS ON MONITOR  ON Monday-  VE - CLOSED.         INSTR--  REST,  FLUID.     THEN  TODAY AT   5 PM  BECAME  WORSE.   -   TONIGHT  CALLED   AT 1045PM-  TOLD  TO COME IN.       DENIES HSV AND MRSA.   LAST SEX-     2 MTHS  AGO.

## 2016-03-06 NOTE — MAU Note (Signed)
Beta- Group B Strep order cancelled due to patient said she had a urine test and culture done in March by Charlotta Newtonzan MD. She is Group B POSITIVE, and does not want to be swabbed again. I notified HH, CNM

## 2016-03-07 LAB — URINE CULTURE

## 2016-03-24 ENCOUNTER — Encounter (HOSPITAL_COMMUNITY): Payer: Self-pay | Admitting: *Deleted

## 2016-03-24 ENCOUNTER — Inpatient Hospital Stay (HOSPITAL_COMMUNITY)
Admission: AD | Admit: 2016-03-24 | Discharge: 2016-03-24 | Disposition: A | Discharge status: Home / Self Care | Payer: 59 | Source: Ambulatory Visit | Attending: Obstetrics & Gynecology | Admitting: Obstetrics & Gynecology

## 2016-03-24 DIAGNOSIS — Z888 Allergy status to other drugs, medicaments and biological substances status: Secondary | ICD-10-CM | POA: Insufficient documentation

## 2016-03-24 DIAGNOSIS — O26893 Other specified pregnancy related conditions, third trimester: Secondary | ICD-10-CM | POA: Insufficient documentation

## 2016-03-24 DIAGNOSIS — O471 False labor at or after 37 completed weeks of gestation: Secondary | ICD-10-CM

## 2016-03-24 DIAGNOSIS — O479 False labor, unspecified: Secondary | ICD-10-CM

## 2016-03-24 DIAGNOSIS — Z79899 Other long term (current) drug therapy: Secondary | ICD-10-CM | POA: Diagnosis not present

## 2016-03-24 DIAGNOSIS — R0989 Other specified symptoms and signs involving the circulatory and respiratory systems: Secondary | ICD-10-CM | POA: Diagnosis not present

## 2016-03-24 DIAGNOSIS — O368130 Decreased fetal movements, third trimester, not applicable or unspecified: Secondary | ICD-10-CM | POA: Insufficient documentation

## 2016-03-24 DIAGNOSIS — Z3A37 37 weeks gestation of pregnancy: Secondary | ICD-10-CM | POA: Diagnosis not present

## 2016-03-24 LAB — COMPREHENSIVE METABOLIC PANEL
ALK PHOS: 196 U/L — AB (ref 38–126)
ALT: 12 U/L — AB (ref 14–54)
ANION GAP: 10 (ref 5–15)
AST: 18 U/L (ref 15–41)
Albumin: 3 g/dL — ABNORMAL LOW (ref 3.5–5.0)
BILIRUBIN TOTAL: 0.8 mg/dL (ref 0.3–1.2)
BUN: 7 mg/dL (ref 6–20)
CALCIUM: 8.6 mg/dL — AB (ref 8.9–10.3)
CO2: 19 mmol/L — ABNORMAL LOW (ref 22–32)
CREATININE: 0.68 mg/dL (ref 0.44–1.00)
Chloride: 105 mmol/L (ref 101–111)
GFR calc non Af Amer: >60 mL/min (ref >60)
Glucose, Bld: 98 mg/dL (ref 65–99)
Potassium: 3.8 mmol/L (ref 3.5–5.1)
Sodium: 134 mmol/L — ABNORMAL LOW (ref 135–145)
TOTAL PROTEIN: 6.7 g/dL (ref 6.5–8.1)

## 2016-03-24 LAB — CBC
HEMATOCRIT: 31.8 % — AB (ref 36.0–46.0)
HEMOGLOBIN: 10.9 g/dL — AB (ref 12.0–15.0)
MCH: 29.4 pg (ref 26.0–34.0)
MCHC: 34.3 g/dL (ref 30.0–36.0)
MCV: 85.7 fL (ref 78.0–100.0)
Platelets: 210 10*3/uL (ref 150–400)
RBC: 3.71 MIL/uL — AB (ref 3.87–5.11)
RDW: 13.7 % (ref 11.5–15.5)
WBC: 7.9 10*3/uL (ref 4.0–10.5)

## 2016-03-24 LAB — PROTEIN / CREATININE RATIO, URINE
Creatinine, Urine: 144 mg/dL
PROTEIN CREATININE RATIO: 0.07 mg/mg{creat} (ref 0.00–0.15)
TOTAL PROTEIN, URINE: 10 mg/dL

## 2016-03-24 NOTE — MAU Provider Note (Signed)
Chief Complaint:  Contractions and Decreased Fetal Movement   First Provider Initiated Contact with Patient 03/24/16 1630     HPI  Maureen Nelson is a 30 y.o. G2P1001 at 11w1dwho presents to maternity admissions reporting contractions and decreased fetal movement today.  She reports good fetal movement now, denies LOF, vaginal bleeding, vaginal itching/burning, urinary symptoms, h/a, dizziness, n/v, diarrhea, constipation or fever/chills.  She denies headache, visual changes or RUQ abdominal pain.  RN Note: Pt. States she has been contracting since last night. Increased to every 8 mins for a couple of hours. Also, feels baby movement has decreased today. Denies LOF or bleeding. PT. States she has a milky mucous discharge today. Next appointment with Dr. Charlotta Newton is Thursday. Pt. States she called MD and they told her to come here for evaluation.   Past Medical History: Past Medical History:  Diagnosis Date  . Complication of anesthesia    Difficulty "waking up"  . Degenerative disc disease, lumbar     Past obstetric history: OB History  Gravida Para Term Preterm AB Living  2 1 1     1   SAB TAB Ectopic Multiple Live Births          1    # Outcome Date GA Lbr Len/2nd Weight Sex Delivery Anes PTL Lv  2 Current           1 Term 11/28/10    F Vag-Spont   LIV      Past Surgical History: Past Surgical History:  Procedure Laterality Date  . BACK SURGERY    . BREAST LUMPECTOMY    . BREAST LUMPECTOMY    . CHOLECYSTECTOMY    . SPINAL FUSION      Family History: Family History  Problem Relation Age of Onset  . Heart disease Maternal Grandmother   . Cancer Maternal Grandfather   . Cancer Paternal Grandfather     Social History: Social History  Substance Use Topics  . Smoking status: Never Smoker  . Smokeless tobacco: Never Used  . Alcohol use No     Comment: Occasional    Allergies:  Allergies  Allergen Reactions  . Dicyclomine Hcl Rash    Meds:  Prescriptions Prior  to Admission  Medication Sig Dispense Refill Last Dose  . acetaminophen (TYLENOL) 500 MG tablet Take 1,000 mg by mouth every 6 (six) hours as needed for mild pain or headache.   03/23/2016 at Unknown time  . famotidine-calcium carbonate-magnesium hydroxide (PEPCID COMPLETE) 10-800-165 MG chewable tablet Chew 1 tablet by mouth 2 (two) times daily as needed (heartburn).    03/23/2016 at Unknown time  . ondansetron (ZOFRAN ODT) 8 MG disintegrating tablet Take 1 tablet (8 mg total) by mouth every 8 (eight) hours as needed for nausea or vomiting. 20 tablet 0 Past Week at Unknown time  . Prenatal Vit-Fe Fumarate-FA (PRENATAL MULTIVITAMIN) TABS tablet Take 1 tablet by mouth at bedtime.   03/23/2016 at Unknown time  . sertraline (ZOLOFT) 25 MG tablet Take 25 mg by mouth at bedtime.    03/23/2016 at Unknown time    I have reviewed patient's Past Medical Hx, Surgical Hx, Family Hx, Social Hx, medications and allergies.   ROS:  Review of Systems  Constitutional: Negative for appetite change, chills, fatigue and fever.  Respiratory: Negative for shortness of breath.   Gastrointestinal: Positive for abdominal pain. Negative for constipation, diarrhea, nausea and vomiting.  Genitourinary: Positive for pelvic pain and vaginal discharge. Negative for dysuria and vaginal bleeding.  Musculoskeletal: Negative for back pain.  Neurological: Negative for dizziness.   Other systems negative  Physical Exam  Patient Vitals for the past 24 hrs:  BP Temp Temp src Pulse Resp  03/24/16 1612 129/72 98.4 F (36.9 C) Oral (!) 125 18   Constitutional: Well-developed, well-nourished female in no acute distress.  Cardiovascular: normal rate and rhythm Respiratory: normal effort, clear to auscultation bilaterally GI: Abd soft, non-tender, gravid appropriate for gestational age.   No rebound or guarding. MS: Extremities nontender, no edema, normal ROM Neurologic: Alert and oriented x 4.  GU: Neg CVAT.   Dilation:  2 Effacement (%): 50 Cervical Position: Posterior Station: -3 Presentation: Vertex Exam by:: Candus Braud CNM   No change in cervix after one hour  FHT:  Baseline 140 , moderate variability, accelerations present, no decelerations Contractions: Irregular    Labs: Results for orders placed or performed during the hospital encounter of 03/24/16 (from the past 24 hour(s))  Protein / creatinine ratio, urine     Status: None   Collection Time: 03/24/16  6:28 PM  Result Value Ref Range   Creatinine, Urine 144.00 mg/dL   Total Protein, Urine 10 mg/dL   Protein Creatinine Ratio 0.07 0.00 - 0.15 mg/mg[Cre]  CBC     Status: Abnormal   Collection Time: 03/24/16  6:37 PM  Result Value Ref Range   WBC 7.9 4.0 - 10.5 K/uL   RBC 3.71 (L) 3.87 - 5.11 MIL/uL   Hemoglobin 10.9 (L) 12.0 - 15.0 g/dL   HCT 16.1 (L) 09.6 - 04.5 %   MCV 85.7 78.0 - 100.0 fL   MCH 29.4 26.0 - 34.0 pg   MCHC 34.3 30.0 - 36.0 g/dL   RDW 40.9 81.1 - 91.4 %   Platelets 210 150 - 400 K/uL  Comprehensive metabolic panel     Status: Abnormal   Collection Time: 03/24/16  6:37 PM  Result Value Ref Range   Sodium 134 (L) 135 - 145 mmol/L   Potassium 3.8 3.5 - 5.1 mmol/L   Chloride 105 101 - 111 mmol/L   CO2 19 (L) 22 - 32 mmol/L   Glucose, Bld 98 65 - 99 mg/dL   BUN 7 6 - 20 mg/dL   Creatinine, Ser 7.82 0.44 - 1.00 mg/dL   Calcium 8.6 (L) 8.9 - 10.3 mg/dL   Total Protein 6.7 6.5 - 8.1 g/dL   Albumin 3.0 (L) 3.5 - 5.0 g/dL   AST 18 15 - 41 U/L   ALT 12 (L) 14 - 54 U/L   Alkaline Phosphatase 196 (H) 38 - 126 U/L   Total Bilirubin 0.8 0.3 - 1.2 mg/dL   GFR calc non Af Amer >60 >60 mL/min   GFR calc Af Amer >60 >60 mL/min   Anion gap 10 5 - 15    Imaging:  No results found.  MAU Course/MDM: I have ordered labs and reviewed results.  NST reviewed, reactive Consult Dr Charlotta Newton with presentation, exam findings and test results.  She had two slightly elevated BPs just before discharge, so we did PIH labs, which were  normal Treatments in MAU included PIH labs (normal).    Assessment: [facility] at [redacted]w[redacted]d  Irregular contractions Decreased Fetal movement. Now normal Labile hypertension, possibly pain related  Plan: Discharge home Labor precautions and fetal kick counts Follow up in Office for prenatal visits and recheck of BP    Medication List    ASK your doctor about these medications   acetaminophen 500 MG tablet Commonly  known as:  TYLENOL Take 1,000 mg by mouth every 6 (six) hours as needed for mild pain or headache.   famotidine-calcium carbonate-magnesium hydroxide 10-800-165 MG chewable tablet Commonly known as:  PEPCID COMPLETE Chew 1 tablet by mouth 2 (two) times daily as needed (heartburn).   ondansetron 8 MG disintegrating tablet Commonly known as:  ZOFRAN ODT Take 1 tablet (8 mg total) by mouth every 8 (eight) hours as needed for nausea or vomiting.   prenatal multivitamin Tabs tablet Take 1 tablet by mouth at bedtime.   sertraline 25 MG tablet Commonly known as:  ZOLOFT Take 25 mg by mouth at bedtime.      Pt stable at time of discharge. Encouraged to return here or to other Urgent Care/ED if she develops worsening of symptoms, increase in pain, fever, or other concerning symptoms.      Wynelle Bourgeois CNM, MSN Certified Nurse-Midwife 03/24/2016 5:53 PM

## 2016-03-24 NOTE — Discharge Instructions (Signed)
Hypertension During Pregnancy °Hypertension, or high blood pressure, is when there is extra pressure inside your blood vessels that carry blood from the heart to the rest of your body (arteries). It can happen at any time in life, including pregnancy. Hypertension during pregnancy can cause problems for you and your baby. Your baby might not weigh as much as he or she should at birth or might be born early (premature). Very bad cases of hypertension during pregnancy can be life-threatening.  °Different types of hypertension can occur during pregnancy. These include: °· Chronic hypertension. This happens when a woman has hypertension before pregnancy and it continues during pregnancy. °· Gestational hypertension. This is when hypertension develops during pregnancy. °· Preeclampsia or toxemia of pregnancy. This is a very serious type of hypertension that develops only during pregnancy. It affects the whole body and can be very dangerous for both mother and baby.   °Gestational hypertension and preeclampsia usually go away after your baby is born. Your blood pressure will likely stabilize within 6 weeks. Women who have hypertension during pregnancy have a greater chance of developing hypertension later in life or with future pregnancies. °RISK FACTORS °There are certain factors that make it more likely for you to develop hypertension during pregnancy. These include: °· Having hypertension before pregnancy. °· Having hypertension during a previous pregnancy. °· Being overweight. °· Being older than 40 years. °· Being pregnant with more than one baby. °· Having diabetes or kidney problems. °SIGNS AND SYMPTOMS °Chronic and gestational hypertension rarely cause symptoms. Preeclampsia has symptoms, which may include: °· Increased protein in your urine. Your health care provider will check for this at every prenatal visit. °· Swelling of your hands and face. °· Rapid weight gain. °· Headaches. °· Visual changes. °· Being  bothered by light. °· Abdominal pain, especially in the upper right area. °· Chest pain. °· Shortness of breath. °· Increased reflexes. °· Seizures. These occur with a more severe form of preeclampsia, called eclampsia. °DIAGNOSIS  °You may be diagnosed with hypertension during a regular prenatal exam. At each prenatal visit, you may have: °· Your blood pressure checked. °· A urine test to check for protein in your urine. °The type of hypertension you are diagnosed with depends on when you developed it. It also depends on your specific blood pressure reading. °· Developing hypertension before 20 weeks of pregnancy is consistent with chronic hypertension. °· Developing hypertension after 20 weeks of pregnancy is consistent with gestational hypertension. °· Hypertension with increased urinary protein is diagnosed as preeclampsia. °· Blood pressure measurements that stay above 160 systolic or 110 diastolic are a sign of severe preeclampsia. °TREATMENT °Treatment for hypertension during pregnancy varies. Treatment depends on the type of hypertension and how serious it is. °· If you take medicine for chronic hypertension, you may need to switch medicines. °¨ Medicines called ACE inhibitors should not be taken during pregnancy. °¨ Low-dose aspirin may be suggested for women who have risk factors for preeclampsia. °· If you have gestational hypertension, you may need to take a blood pressure medicine that is safe during pregnancy. Your health care provider will recommend the correct medicine. °· If you have severe preeclampsia, you may need to be in the hospital. Health care providers will watch you and your baby very closely. You also may need to take medicine called magnesium sulfate to prevent seizures and lower blood pressure. °· Sometimes, an early delivery is needed. This may be the case if the condition worsens. It would be   done to protect you and your baby. The only cure for preeclampsia is delivery.  Your health  care provider may recommend that you take one low-dose aspirin (81 mg) each day to help prevent high blood pressure during your pregnancy if you are at risk for preeclampsia. You may be at risk for preeclampsia if:  You had preeclampsia or eclampsia during a previous pregnancy.  Your baby did not grow as expected during a previous pregnancy.  You experienced preterm birth with a previous pregnancy.  You experienced a separation of the placenta from the uterus (placental abruption) during a previous pregnancy.  You experienced the loss of your baby during a previous pregnancy.  You are pregnant with more than one baby.  You have other medical conditions, such as diabetes or an autoimmune disease. HOME CARE INSTRUCTIONS  Schedule and keep all of your regular prenatal care appointments. This is important.  Take medicines only as directed by your health care provider. Tell your health care provider about all medicines you take.  Eat as little salt as possible.  Get regular exercise.  Do not drink alcohol.  Do not use tobacco products.  Do not drink products with caffeine.  Lie on your left side when resting. SEEK IMMEDIATE MEDICAL CARE IF:  You have severe abdominal pain.  You have sudden swelling in your hands, ankles, or face.  You gain 4 pounds (1.8 kg) or more in 1 week.  You vomit repeatedly.  You have vaginal bleeding.  You do not feel your baby moving as much.  You have a headache.  You have blurred or double vision.  You have muscle twitching or spasms.  You have shortness of breath.  You have blue fingernails or lips.  You have blood in your urine. MAKE SURE YOU:  Understand these instructions.  Will watch your condition.  Will get help right away if you are not doing well or get worse.   This information is not intended to replace advice given to you by your health care provider. Make sure you discuss any questions you have with your health care  provider.   Document Released: 05/06/2011 Document Revised: 09/08/2014 Document Reviewed: 03/17/2013 Elsevier Interactive Patient Education 2016 [employer]. Vaginal Delivery During delivery, your health care provider will help you give birth to your baby. During a vaginal delivery, you will work to push the baby out of your vagina. However, before you can push your baby out, a few things need to happen. The opening of your uterus (cervix) has to soften, thin out, and open up (dilate) all the way to 10 cm. Also, your baby has to move down from the uterus into your vagina.  SIGNS OF LABOR  Your health care provider will first need to make sure you are in labor. Signs of labor include:   Passing what is called the mucous plug before labor begins. This is a small amount of blood-stained mucus.  Having regular, painful uterine contractions.   The time between contractions gets shorter.   The discomfort and pain gradually get more intense.  Contraction pains get worse when walking and do not go away when resting.   Your cervix becomes thinner (effacement) and dilates. BEFORE THE DELIVERY Once you are in labor and admitted into the hospital or care center, your health care provider may do the following:   Perform a complete physical exam.  Review any complications related to pregnancy or labor.  Check your blood pressure, pulse, temperature, and heart  rate (vital signs).   Determine if, and when, the rupture of amniotic membranes occurred.  Do a vaginal exam (using a sterile glove and lubricant) to determine:   The position (presentation) of the baby. Is the baby's head presenting first (vertex) in the birth canal (vagina), or are the feet or buttocks first (breech)?   The level (station) of the baby's head within the birth canal.   The effacement and dilatation of the cervix.   An electronic fetal monitor is usually placed on your abdomen when you first arrive. This is  used to monitor your contractions and the baby's heart rate.  When the monitor is on your abdomen (external fetal monitor), it can only pick up the frequency and length of your contractions. It cannot tell the strength of your contractions.  If it becomes necessary for your health care provider to know exactly how strong your contractions are or to see exactly what the baby's heart rate is doing, an internal monitor may be inserted into your vagina and uterus. Your health care provider will discuss the benefits and risks of using an internal monitor and obtain your permission before inserting the device.  Continuous fetal monitoring may be needed if you have an epidural, are receiving certain medicines (such as oxytocin), or have pregnancy or labor complications.  An IV access tube may be placed into a vein in your arm to deliver fluids and medicines if necessary. THREE STAGES OF LABOR AND DELIVERY Normal labor and delivery is divided into three stages. First Stage This stage starts when you begin to contract regularly and your cervix begins to efface and dilate. It ends when your cervix is completely open (fully dilated). The first stage is the longest stage of labor and can last from 3 hours to 15 hours.  Several methods are available to help with labor pain. You and your health care provider will decide which option is best for you. Options include:   Opioid medicines. These are strong pain medicines that you can get through your IV tube or as a shot into your muscle. These medicines lessen pain but do not make it go away completely.  Epidural. A medicine is given through a thin tube that is inserted in your back. The medicine numbs the lower part of your body and prevents any pain in that area.  Paracervical pain medicine. This is an injection of an anesthetic on each side of your cervix.   You may request natural childbirth, which does not involve the use of pain medicines or an epidural  during labor and delivery. Instead, you will use other things, such as breathing exercises, to help cope with the pain. Second Stage The second stage of labor begins when your cervix is fully dilated at 10 cm. It continues until you push your baby down through the birth canal and the baby is born. This stage can take only minutes or several hours.  The location of your baby's head as it moves through the birth canal is reported as a number called a station. If the baby's head has not started its descent, the station is described as being at minus 3 (-3). When your baby's head is at the zero station, it is at the middle of the birth canal and is engaged in the pelvis. The station of your baby helps indicate the progress of the second stage of labor.  When your baby is born, your health care provider may hold the baby with his or  her head lowered to prevent amniotic fluid, mucus, and blood from getting into the baby's lungs. The baby's mouth and nose may be suctioned with a small bulb syringe to remove any additional fluid.  Your health care provider may then place the baby on your stomach. It is important to keep the baby from getting cold. To do this, the health care provider will dry the baby off, place the baby directly on your skin (with no blankets between you and the baby), and cover the baby with warm, dry blankets.   The umbilical cord is cut. Third Stage During the third stage of labor, your health care provider will deliver the placenta (afterbirth) and make sure your bleeding is under control. The delivery of the placenta usually takes about 5 minutes but can take up to 30 minutes. After the placenta is delivered, a medicine may be given either by IV or injection to help contract the uterus and control bleeding. If you are planning to breastfeed, you can try to do so now. After you deliver the placenta, your uterus should contract and get very firm. If your uterus does not remain firm, your  health care provider will massage it. This is important because the contraction of the uterus helps cut off bleeding at the site where the placenta was attached to your uterus. If your uterus does not contract properly and stay firm, you may continue to bleed heavily. If there is a lot of bleeding, medicines may be given to contract the uterus and stop the bleeding.    This information is not intended to replace advice given to you by your health care provider. Make sure you discuss any questions you have with your health care provider.   Document Released: 05/27/2008 Document Revised: 09/08/2014 Document Reviewed: 04/14/2012 Elsevier Interactive Patient Education [employer]

## 2016-03-24 NOTE — MAU Note (Signed)
Pt. States she has been contracting since last night. Increased to every 8 mins for a couple of hours. Also, feels baby movement has decreased today. Denies LOF or bleeding. PT. States she has a milky mucous discharge today. Next appointment with Dr. Charlotta Newton is Thursday. Pt. States she called MD and they told her to come here for evaluation.

## 2016-04-03 ENCOUNTER — Encounter (HOSPITAL_COMMUNITY): Payer: Self-pay | Admitting: *Deleted

## 2016-04-03 ENCOUNTER — Inpatient Hospital Stay (HOSPITAL_COMMUNITY)
Admission: AD | Admit: 2016-04-03 | Discharge: 2016-04-03 | Disposition: A | Discharge status: Home / Self Care | Payer: 59 | Source: Ambulatory Visit | Attending: Obstetrics & Gynecology | Admitting: Obstetrics & Gynecology

## 2016-04-03 DIAGNOSIS — O9989 Other specified diseases and conditions complicating pregnancy, childbirth and the puerperium: Secondary | ICD-10-CM | POA: Diagnosis not present

## 2016-04-03 DIAGNOSIS — G43009 Migraine without aura, not intractable, without status migrainosus: Secondary | ICD-10-CM | POA: Insufficient documentation

## 2016-04-03 DIAGNOSIS — Z888 Allergy status to other drugs, medicaments and biological substances status: Secondary | ICD-10-CM | POA: Insufficient documentation

## 2016-04-03 DIAGNOSIS — O99353 Diseases of the nervous system complicating pregnancy, third trimester: Secondary | ICD-10-CM | POA: Insufficient documentation

## 2016-04-03 DIAGNOSIS — Z3A38 38 weeks gestation of pregnancy: Secondary | ICD-10-CM | POA: Insufficient documentation

## 2016-04-03 DIAGNOSIS — Z7982 Long term (current) use of aspirin: Secondary | ICD-10-CM | POA: Insufficient documentation

## 2016-04-03 DIAGNOSIS — Z79899 Other long term (current) drug therapy: Secondary | ICD-10-CM | POA: Insufficient documentation

## 2016-04-03 HISTORY — DX: Headache: R51

## 2016-04-03 HISTORY — DX: Headache, unspecified: R51.9

## 2016-04-03 LAB — CBC
HCT: 32.2 % — ABNORMAL LOW (ref 36.0–46.0)
Hemoglobin: 11 g/dL — ABNORMAL LOW (ref 12.0–15.0)
MCH: 29.1 pg (ref 26.0–34.0)
MCHC: 34.2 g/dL (ref 30.0–36.0)
MCV: 85.2 fL (ref 78.0–100.0)
PLATELETS: 217 10*3/uL (ref 150–400)
RBC: 3.78 MIL/uL — AB (ref 3.87–5.11)
RDW: 13.9 % (ref 11.5–15.5)
WBC: 7.8 10*3/uL (ref 4.0–10.5)

## 2016-04-03 LAB — COMPREHENSIVE METABOLIC PANEL
ALBUMIN: 2.9 g/dL — AB (ref 3.5–5.0)
ALT: 12 U/L — ABNORMAL LOW (ref 14–54)
ANION GAP: 10 (ref 5–15)
AST: 15 U/L (ref 15–41)
Alkaline Phosphatase: 223 U/L — ABNORMAL HIGH (ref 38–126)
BILIRUBIN TOTAL: 1 mg/dL (ref 0.3–1.2)
BUN: 8 mg/dL (ref 6–20)
CALCIUM: 8.6 mg/dL — AB (ref 8.9–10.3)
CHLORIDE: 106 mmol/L (ref 101–111)
CO2: 19 mmol/L — ABNORMAL LOW (ref 22–32)
CREATININE: 0.67 mg/dL (ref 0.44–1.00)
GFR calc Af Amer: >60 mL/min (ref >60)
GFR calc non Af Amer: >60 mL/min (ref >60)
Glucose, Bld: 70 mg/dL (ref 65–99)
POTASSIUM: 3.9 mmol/L (ref 3.5–5.1)
SODIUM: 135 mmol/L (ref 135–145)
Total Protein: 6.8 g/dL (ref 6.5–8.1)

## 2016-04-03 LAB — URINALYSIS, ROUTINE W REFLEX MICROSCOPIC
Bilirubin Urine: NEGATIVE
Glucose, UA: NEGATIVE mg/dL
Ketones, ur: 15 mg/dL — AB
NITRITE: NEGATIVE
Protein, ur: NEGATIVE mg/dL
SPECIFIC GRAVITY, URINE: 1.015 (ref 1.005–1.030)
pH: 7 (ref 5.0–8.0)

## 2016-04-03 LAB — URINE MICROSCOPIC-ADD ON: RBC / HPF: NONE SEEN RBC/hpf (ref 0–5)

## 2016-04-03 LAB — PROTEIN / CREATININE RATIO, URINE
CREATININE, URINE: 225 mg/dL
Protein Creatinine Ratio: 0.07 mg/mg{Cre} (ref 0.00–0.15)
Total Protein, Urine: 16 mg/dL

## 2016-04-03 MED ORDER — SODIUM CHLORIDE 0.9 % IV SOLN
INTRAVENOUS | Status: DC
  Administered 2016-04-03: 18:00:00 via INTRAVENOUS

## 2016-04-03 MED ORDER — BUTALBITAL-APAP-CAFF-COD 50-325-40-30 MG PO CAPS: 1.0000 | ORAL_CAPSULE | ORAL | 0 refills | Status: DC | PRN

## 2016-04-03 MED ORDER — METOCLOPRAMIDE HCL 5 MG/ML IJ SOLN
10.0000 mg | Freq: Once | INTRAMUSCULAR | Status: AC
  Administered 2016-04-03: 10 mg via INTRAVENOUS
  Filled 2016-04-03: qty 2

## 2016-04-03 MED ORDER — DEXAMETHASONE SODIUM PHOSPHATE 10 MG/ML IJ SOLN
10.0000 mg | Freq: Once | INTRAMUSCULAR | Status: AC
  Administered 2016-04-03: 10 mg via INTRAVENOUS
  Filled 2016-04-03: qty 1

## 2016-04-03 MED ORDER — DIPHENHYDRAMINE HCL 50 MG/ML IJ SOLN
25.0000 mg | Freq: Once | INTRAMUSCULAR | Status: AC
  Administered 2016-04-03: 25 mg via INTRAVENOUS
  Filled 2016-04-03: qty 1

## 2016-04-03 NOTE — MAU Provider Note (Signed)
History     CSN: [id number]  Arrival date and time: 04/03/16 1621   None     Chief Complaint  Patient presents with  . Headache   HPI  [name] is a 30 year old G2 P1 at 38 weeks and 4 days who presents for worsening headache. Headache started yesterday and she started taking Tylenol. When it did not improve she called her obstetrician who recommended she take Excedrin Migraine and rest. She does have a history of migraine headaches but has not had any this pregnancy. She reports that her headache is kind of all over in her head and she does have floaters in her vision associated with this headache. Additionally she has nausea and vomiting. She denies any right upper quadrant pain or significant swelling. She has no other concerns. Her pregnancy has otherwise been uneventful.  OB History    Gravida Para Term Preterm AB Living   SAB TAB Ectopic Multiple Live Births           1      Past Medical History:  Diagnosis Date  . Complication of anesthesia    Difficulty "waking up"  . Degenerative disc disease, lumbar   . Degenerative disc disease, lumbar   . Headache     Past Surgical History:  Procedure Laterality Date  . BACK SURGERY    . BREAST LUMPECTOMY    . BREAST LUMPECTOMY    . CHOLECYSTECTOMY    . SPINAL FUSION      Family History  Problem Relation Age of Onset  . Heart disease Maternal Grandmother   . Cancer Maternal Grandfather   . Cancer Paternal Grandfather     Social History  Substance Use Topics  . Smoking status: Never Smoker  . Smokeless tobacco: Never Used  . Alcohol use No     Comment: Occasional    Allergies:  Allergies  Allergen Reactions  . Dicyclomine Hcl Rash    Prescriptions Prior to Admission  Medication Sig Dispense Refill Last Dose  . acetaminophen (TYLENOL) 500 MG tablet Take 1,000 mg by mouth every 6 (six) hours as needed for mild pain or headache.   04/03/2016 at Unknown time  .  aspirin-acetaminophen-caffeine (EXCEDRIN MIGRAINE) 250-250-65 MG tablet Take 2 tablets by mouth every 6 (six) hours as needed for headache or migraine.   04/03/2016 at Unknown time  . famotidine-calcium carbonate-magnesium hydroxide (PEPCID COMPLETE) 10-800-165 MG chewable tablet Chew 1 tablet by mouth 2 (two) times daily as needed (heartburn).    04/02/2016 at Unknown time  . ondansetron (ZOFRAN ODT) 8 MG disintegrating tablet Take 1 tablet (8 mg total) by mouth every 8 (eight) hours as needed for nausea or vomiting. 20 tablet 0 Past Week at Unknown time  . Prenatal Vit-Fe Fumarate-FA (PRENATAL MULTIVITAMIN) TABS tablet Take 1 tablet by mouth at bedtime.   04/02/2016 at Unknown time  . sertraline (ZOLOFT) 25 MG tablet Take 25 mg by mouth at bedtime.    04/02/2016 at Unknown time    Review of Systems  Constitutional: Negative for chills and fever.  HENT: Negative for ear pain and nosebleeds.   Eyes: Positive for blurred vision and photophobia. Negative for double vision.  Respiratory: Negative for cough, hemoptysis and sputum production.   Cardiovascular: Negative for chest pain, palpitations and orthopnea.  Gastrointestinal: Negative for heartburn, nausea and vomiting.  Genitourinary: Negative for dysuria and urgency.  Musculoskeletal: Negative for myalgias and neck pain.  Skin:  Negative for rash.  Neurological: Positive for headaches. Negative for dizziness, sensory change, speech change and focal weakness.   Physical Exam   Blood pressure 119/66, pulse 89, temperature 97.2 F (36.2 C), temperature source Oral, resp. rate 18, last menstrual period 06/17/2015.  Physical Exam  Constitutional: She appears well-developed and well-nourished.  HENT:  Head: Normocephalic and atraumatic.  Eyes: Conjunctivae are normal. Pupils are equal, round, and reactive to light.  Neck: Normal range of motion. Neck supple.  Cardiovascular: Normal rate, regular rhythm and normal heart sounds.   Respiratory: Effort  normal and breath sounds normal.  GI: Soft. Bowel sounds are normal.  Gravid  Neurological: She is alert. She has normal strength. No cranial nerve deficit or sensory deficit.  Reflex Scores:      Patellar reflexes are 2+ on the right side and 2+ on the left side.  Fetal heart tones: NST performed and reactive MAU Course  Procedures  MDM In the MAU patient did receive 10 mg of Decadron IV, 25 mg of Benadryl IV, 10 mg of Reglan IV as well as lactated Ringer solution at 1 50 mL an hour. Patient reports improvement in her headache from a 8 or 9 to a 3 with this intervention. She feels as though she is ready to go home.  We did check a protein to creatinine ratio as well as a CMP and a CBC while patient was here to rule out preeclampsia with headache and blurred vision. These were all within normal limits and patient's blood pressure remained well under 140/90.  Assessment and Plan  Migraine without aura and without status migrainosus, not intractable  Patient with migraine headache. Improved with migraine cocktail. Patient was sent home with a prescription for a few Fioricet in case patient gets headache. She was counseled against rebound using this medication.  Will provide patient with a work excuse for today and tomorrow. Case was discussed with Dr. Otilio Connors 04/03/2016, 5:13 PM

## 2016-04-03 NOTE — Discharge Instructions (Signed)

## 2016-04-03 NOTE — MAU Note (Signed)
C/o migraine headache since last night; c/o nausea also due to the pain; hx of migraines; BP is  119/66

## 2016-04-05 ENCOUNTER — Encounter (HOSPITAL_COMMUNITY): Payer: Self-pay | Admitting: *Deleted

## 2016-04-05 ENCOUNTER — Inpatient Hospital Stay (HOSPITAL_COMMUNITY)
Admission: AD | Admit: 2016-04-05 | Discharge: 2016-04-05 | Disposition: A | Discharge status: Home / Self Care | Payer: 59 | Source: Ambulatory Visit | Attending: Obstetrics & Gynecology | Admitting: Obstetrics & Gynecology

## 2016-04-05 LAB — URINALYSIS, ROUTINE W REFLEX MICROSCOPIC
Bilirubin Urine: NEGATIVE
Glucose, UA: NEGATIVE mg/dL
Hgb urine dipstick: NEGATIVE
KETONES UR: NEGATIVE mg/dL
LEUKOCYTES UA: NEGATIVE
NITRITE: NEGATIVE
PH: 6 (ref 5.0–8.0)
PROTEIN: NEGATIVE mg/dL
Specific Gravity, Urine: 1.025 (ref 1.005–1.030)

## 2016-04-05 NOTE — MAU Note (Signed)
Contractions on and off all day Friday. MOre regular since 2300. Denies LOF or bleeding

## 2016-04-06 ENCOUNTER — Encounter (HOSPITAL_COMMUNITY): Payer: Self-pay | Admitting: *Deleted

## 2016-04-06 ENCOUNTER — Inpatient Hospital Stay (HOSPITAL_COMMUNITY): Payer: 59 | Admitting: Anesthesiology

## 2016-04-06 ENCOUNTER — Inpatient Hospital Stay (HOSPITAL_COMMUNITY)
Admission: AD | Admit: 2016-04-06 | Discharge: 2016-04-08 | DRG: 775 | Disposition: A | Discharge status: Home / Self Care | Payer: 59 | Source: Ambulatory Visit | Attending: Obstetrics and Gynecology | Admitting: Obstetrics and Gynecology

## 2016-04-06 DIAGNOSIS — Z8249 Family history of ischemic heart disease and other diseases of the circulatory system: Secondary | ICD-10-CM | POA: Diagnosis not present

## 2016-04-06 DIAGNOSIS — O99824 Streptococcus B carrier state complicating childbirth: Secondary | ICD-10-CM | POA: Diagnosis present

## 2016-04-06 DIAGNOSIS — O99344 Other mental disorders complicating childbirth: Secondary | ICD-10-CM | POA: Diagnosis present

## 2016-04-06 DIAGNOSIS — Z3A39 39 weeks gestation of pregnancy: Secondary | ICD-10-CM

## 2016-04-06 DIAGNOSIS — F419 Anxiety disorder, unspecified: Secondary | ICD-10-CM | POA: Diagnosis present

## 2016-04-06 DIAGNOSIS — Z3483 Encounter for supervision of other normal pregnancy, third trimester: Secondary | ICD-10-CM | POA: Diagnosis present

## 2016-04-06 LAB — CBC
HEMATOCRIT: 29.8 % — AB (ref 36.0–46.0)
HEMOGLOBIN: 10.3 g/dL — AB (ref 12.0–15.0)
MCH: 29.3 pg (ref 26.0–34.0)
MCHC: 34.6 g/dL (ref 30.0–36.0)
MCV: 84.7 fL (ref 78.0–100.0)
Platelets: 195 10*3/uL (ref 150–400)
RBC: 3.52 MIL/uL — ABNORMAL LOW (ref 3.87–5.11)
RDW: 14 % (ref 11.5–15.5)
WBC: 8.7 10*3/uL (ref 4.0–10.5)

## 2016-04-06 LAB — OB RESULTS CONSOLE GBS: STREP GROUP B AG: POSITIVE

## 2016-04-06 LAB — TYPE AND SCREEN
ABO/RH(D): O POS
ANTIBODY SCREEN: NEGATIVE

## 2016-04-06 LAB — RPR: RPR Ser Ql: NONREACTIVE

## 2016-04-06 LAB — ABO/RH: ABO/RH(D): O POS

## 2016-04-06 MED ORDER — OXYTOCIN 40 UNITS IN LACTATED RINGERS INFUSION - SIMPLE MED
2.5000 [IU]/h | INTRAVENOUS | Status: DC
  Administered 2016-04-06: 39.96 [IU]/h via INTRAVENOUS

## 2016-04-06 MED ORDER — PENICILLIN G POTASSIUM 5000000 UNITS IJ SOLR
2.5000 10*6.[IU] | INTRAMUSCULAR | Status: DC
  Administered 2016-04-06 (×2): 2.5 10*6.[IU] via INTRAVENOUS
  Filled 2016-04-06 (×6): qty 2.5

## 2016-04-06 MED ORDER — TERBUTALINE SULFATE 1 MG/ML IJ SOLN: 0.2500 mg | Freq: Once | INTRAMUSCULAR | Status: DC | PRN

## 2016-04-06 MED ORDER — OXYTOCIN 40 UNITS IN LACTATED RINGERS INFUSION - SIMPLE MED
1.0000 m[IU]/min | INTRAVENOUS | Status: DC
  Administered 2016-04-06: 2 m[IU]/min via INTRAVENOUS
  Filled 2016-04-06: qty 1000

## 2016-04-06 MED ORDER — SERTRALINE HCL 25 MG PO TABS
25.0000 mg | ORAL_TABLET | Freq: Every day | ORAL | Status: DC
  Filled 2016-04-06: qty 1

## 2016-04-06 MED ORDER — OXYTOCIN BOLUS FROM INFUSION: 500.0000 mL | Freq: Once | INTRAVENOUS | Status: DC

## 2016-04-06 MED ORDER — LACTATED RINGERS IV SOLN
500.0000 mL | INTRAVENOUS | Status: DC | PRN
  Administered 2016-04-06: 1000 mL via INTRAVENOUS

## 2016-04-06 MED ORDER — PNEUMOCOCCAL VAC POLYVALENT 25 MCG/0.5ML IJ INJ
0.5000 mL | INJECTION | INTRAMUSCULAR | Status: DC
  Filled 2016-04-06: qty 0.5

## 2016-04-06 MED ORDER — PENICILLIN G POTASSIUM 5000000 UNITS IJ SOLR
5.0000 10*6.[IU] | Freq: Once | INTRAVENOUS | Status: AC
  Administered 2016-04-06: 5 10*6.[IU] via INTRAVENOUS
  Filled 2016-04-06: qty 5

## 2016-04-06 MED ORDER — DIBUCAINE 1 % RE OINT: 1.0000 "application " | TOPICAL_OINTMENT | RECTAL | Status: DC | PRN

## 2016-04-06 MED ORDER — FENTANYL CITRATE (PF) 100 MCG/2ML IJ SOLN: 50.0000 ug | INTRAMUSCULAR | Status: DC | PRN

## 2016-04-06 MED ORDER — OXYCODONE-ACETAMINOPHEN 5-325 MG PO TABS: 2.0000 | ORAL_TABLET | ORAL | Status: DC | PRN

## 2016-04-06 MED ORDER — ACETAMINOPHEN 325 MG PO TABS: 650.0000 mg | ORAL_TABLET | ORAL | Status: DC | PRN

## 2016-04-06 MED ORDER — LACTATED RINGERS IV SOLN: 500.0000 mL | Freq: Once | INTRAVENOUS | Status: DC

## 2016-04-06 MED ORDER — ONDANSETRON HCL 4 MG PO TABS
4.0000 mg | ORAL_TABLET | ORAL | Status: DC | PRN
  Administered 2016-04-07 (×2): 4 mg via ORAL
  Filled 2016-04-06 (×2): qty 1

## 2016-04-06 MED ORDER — WITCH HAZEL-GLYCERIN EX PADS
1.0000 "application " | MEDICATED_PAD | CUTANEOUS | Status: DC | PRN
  Administered 2016-04-07: 1 via TOPICAL

## 2016-04-06 MED ORDER — SENNOSIDES-DOCUSATE SODIUM 8.6-50 MG PO TABS
2.0000 | ORAL_TABLET | ORAL | Status: DC
  Administered 2016-04-06 – 2016-04-07 (×2): 2 via ORAL
  Filled 2016-04-06 (×2): qty 2

## 2016-04-06 MED ORDER — ACETAMINOPHEN 325 MG PO TABS
650.0000 mg | ORAL_TABLET | ORAL | Status: DC | PRN
Start: 2016-04-06 — End: 2016-04-08
  Administered 2016-04-06 – 2016-04-08 (×7): 650 mg via ORAL
  Filled 2016-04-06 (×7): qty 2

## 2016-04-06 MED ORDER — LIDOCAINE HCL (PF) 1 % IJ SOLN
INTRAMUSCULAR | Status: DC | PRN
  Administered 2016-04-06: 6 mL via EPIDURAL
  Administered 2016-04-06: 4 mL

## 2016-04-06 MED ORDER — BENZOCAINE-MENTHOL 20-0.5 % EX AERO
1.0000 "application " | INHALATION_SPRAY | CUTANEOUS | Status: DC | PRN
  Administered 2016-04-06: 1 via TOPICAL
  Filled 2016-04-06 (×3): qty 56

## 2016-04-06 MED ORDER — SOD CITRATE-CITRIC ACID 500-334 MG/5ML PO SOLN: 30.0000 mL | ORAL | Status: DC | PRN

## 2016-04-06 MED ORDER — IBUPROFEN 600 MG PO TABS
600.0000 mg | ORAL_TABLET | Freq: Four times a day (QID) | ORAL | Status: DC
  Administered 2016-04-06 – 2016-04-08 (×8): 600 mg via ORAL
  Filled 2016-04-06 (×8): qty 1

## 2016-04-06 MED ORDER — EPHEDRINE 5 MG/ML INJ: 10.0000 mg | INTRAVENOUS | Status: DC | PRN

## 2016-04-06 MED ORDER — LIDOCAINE HCL (PF) 1 % IJ SOLN: 30.0000 mL | INTRAMUSCULAR | Status: DC | PRN

## 2016-04-06 MED ORDER — ZOLPIDEM TARTRATE 5 MG PO TABS: 5.0000 mg | ORAL_TABLET | Freq: Every evening | ORAL | Status: DC | PRN

## 2016-04-06 MED ORDER — PRENATAL MULTIVITAMIN CH
1.0000 | ORAL_TABLET | Freq: Every day | ORAL | Status: DC
  Administered 2016-04-07: 1 via ORAL
  Filled 2016-04-06: qty 1

## 2016-04-06 MED ORDER — COCONUT OIL OIL
1.0000 "application " | TOPICAL_OIL | Status: DC | PRN
  Filled 2016-04-06: qty 120

## 2016-04-06 MED ORDER — LACTATED RINGERS IV SOLN
INTRAVENOUS | Status: DC
  Administered 2016-04-06 (×2): via INTRAVENOUS

## 2016-04-06 MED ORDER — ONDANSETRON HCL 4 MG/2ML IJ SOLN: 4.0000 mg | INTRAMUSCULAR | Status: DC | PRN

## 2016-04-06 MED ORDER — ONDANSETRON HCL 4 MG/2ML IJ SOLN
4.0000 mg | Freq: Four times a day (QID) | INTRAMUSCULAR | Status: DC | PRN
  Administered 2016-04-06: 4 mg via INTRAVENOUS
  Filled 2016-04-06: qty 2

## 2016-04-06 MED ORDER — SIMETHICONE 80 MG PO CHEW: 80.0000 mg | CHEWABLE_TABLET | ORAL | Status: DC | PRN

## 2016-04-06 MED ORDER — TETANUS-DIPHTH-ACELL PERTUSSIS 5-2.5-18.5 LF-MCG/0.5 IM SUSP
0.5000 mL | Freq: Once | INTRAMUSCULAR | Status: DC
  Filled 2016-04-06: qty 0.5

## 2016-04-06 MED ORDER — FENTANYL 2.5 MCG/ML BUPIVACAINE 1/10 % EPIDURAL INFUSION (WH - ANES)
14.0000 mL/h | INTRAMUSCULAR | Status: DC | PRN
  Administered 2016-04-06: 14 mL/h via EPIDURAL
  Filled 2016-04-06: qty 125

## 2016-04-06 MED ORDER — SERTRALINE HCL 50 MG PO TABS
50.0000 mg | ORAL_TABLET | Freq: Every day | ORAL | Status: DC
  Administered 2016-04-06 – 2016-04-07 (×2): 50 mg via ORAL
  Filled 2016-04-06 (×2): qty 1

## 2016-04-06 MED ORDER — PHENYLEPHRINE 40 MCG/ML (10ML) SYRINGE FOR IV PUSH (FOR BLOOD PRESSURE SUPPORT)
80.0000 ug | PREFILLED_SYRINGE | INTRAVENOUS | Status: DC | PRN
Start: 2016-04-06 — End: 2016-04-06

## 2016-04-06 MED ORDER — OXYCODONE-ACETAMINOPHEN 5-325 MG PO TABS
1.0000 | ORAL_TABLET | ORAL | Status: DC | PRN
Start: 2016-04-06 — End: 2016-04-06

## 2016-04-06 MED ORDER — DIPHENHYDRAMINE HCL 50 MG/ML IJ SOLN: 12.5000 mg | INTRAMUSCULAR | Status: DC | PRN

## 2016-04-06 MED ORDER — DIPHENHYDRAMINE HCL 25 MG PO CAPS: 25.0000 mg | ORAL_CAPSULE | Freq: Four times a day (QID) | ORAL | Status: DC | PRN

## 2016-04-06 MED ORDER — PHENYLEPHRINE 40 MCG/ML (10ML) SYRINGE FOR IV PUSH (FOR BLOOD PRESSURE SUPPORT)
80.0000 ug | PREFILLED_SYRINGE | INTRAVENOUS | Status: DC | PRN
  Filled 2016-04-06: qty 10

## 2016-04-06 MED ORDER — FLEET ENEMA 7-19 GM/118ML RE ENEM: 1.0000 | ENEMA | RECTAL | Status: DC | PRN

## 2016-04-06 NOTE — Anesthesia Pain Management Evaluation Note (Signed)
  CRNA Pain Management Visit Note  Patient: Maureen Nelson, 30 y.o., female  "Hello I am a member of the anesthesia [facility] at [facility]. We have an anesthesia team available at all times to provide care throughout the hospital, including epidural management and anesthesia for C-section. I don't know your plan for the delivery whether it a natural birth, water birth, IV sedation, nitrous supplementation, doula or epidural, but we want to meet your pain goals."   1.Was your pain managed to your expectations on prior hospitalizations?   Yes   2.What is your expectation for pain management during this hospitalization?     Epidural  3.How can we help you reach that goal? Epidural in place at time of visit  Record the patient's initial score and the patient's pain goal.   Pain: 0  Pain Goal: 2 [facility] wants you to be able to say your pain was always managed very well.  Rica RecordsICKELTON,Wise Fees 04/06/2016

## 2016-04-06 NOTE — Anesthesia Procedure Notes (Signed)

## 2016-04-06 NOTE — Progress Notes (Signed)
OB PN:  S: Pt resting comfortably, no acute complaints  O: BP 126/79   Pulse (!) 102   Temp 98.4 F (36.9 C) (Oral)   Resp 18   Ht 5\' 7"  (1.702 m)   Wt 101.2 kg (223 lb)   LMP 06/17/2015   SpO2 98%   BMI 34.93 kg/m   FHT: 125bpm, moderate variablity, + accels, no decels Toco: q2-494min SVE: 3/50-3, AROM minimal fluid  A/P: 30 y.o. G2P1001 @ [id number] who presenting in latent labor 1. FWB: Cat. I 2. Labor: continue Pit per protocol Pain: epidural upon request GBS: positive, PCN per protocol Anxiety: continue Zoloft 25mg  daily  Myna HidalgoJennifer Olina Melfi, DO [phone] (pager) [phone] (office)

## 2016-04-06 NOTE — Anesthesia Preprocedure Evaluation (Signed)

## 2016-04-06 NOTE — MAU Note (Signed)
Patient presents to mau with c/o increasing contractions and vaginal pressure. Denies LOF, or VB at this time. +FM. Endorses being told at last night she was 2cm.

## 2016-04-06 NOTE — Lactation Note (Addendum)
This note was copied from a baby's chart. Lactation Consultation Note Initial visit at 9 hours of age.  Mom reports experience with older child for a year. Mom reports a few feedings and denies concerns at this time.  Mom is holding baby asleep in her arms.  Mom also spoon fed a few mls of expressed colostrum. [facility] resources given and discussed.  Encouraged to feed with early cues on demand.  Early newborn behavior discussed.  Mom to call for assist as needed.   Patient Name: Maureen Nelson ZOXWR'UToday's Date: 04/06/2016 Reason for consult: Initial assessment   Maternal Data Has patient been taught Hand Expression?: Yes Does the patient have breastfeeding experience prior to this delivery?: Yes  Feeding Feeding Type: Breast Milk  LATCH Score/Interventions                      Lactation Tools Discussed/Used     Consult Status Consult Status: Follow-up Date: 04/07/16 Follow-up type: In-patient    Shoptaw, Arvella MerlesJana Lynn 04/06/2016, 11:16 PM

## 2016-04-07 LAB — CBC
HEMATOCRIT: 28.6 % — AB (ref 36.0–46.0)
Hemoglobin: 9.7 g/dL — ABNORMAL LOW (ref 12.0–15.0)
MCH: 29.8 pg (ref 26.0–34.0)
MCHC: 33.9 g/dL (ref 30.0–36.0)
MCV: 87.7 fL (ref 78.0–100.0)
Platelets: 183 10*3/uL (ref 150–400)
RBC: 3.26 MIL/uL — ABNORMAL LOW (ref 3.87–5.11)
RDW: 13.9 % (ref 11.5–15.5)
WBC: 9.7 10*3/uL (ref 4.0–10.5)

## 2016-04-07 NOTE — Progress Notes (Signed)
Postpartum Note Day # 1  S:  Patient resting comfortable in bed.  Pain controlled.  Tolerating general diet. + flatus, no BM.  Lochia appropriate.  Ambulating without difficulty.  She denies n/v/f/c, SOB, or CP.  Pt plans on breastfeeding.  O: Temp:  [97.4 F (36.3 C)-98.4 F (36.9 C)] 97.4 F (36.3 C) (08/07 0516) Pulse Rate:  [74-109] 74 (08/07 0516) Resp:  [16-20] 18 (08/07 0516) BP: (112-126)/(67-97) 116/97 (08/07 0516) SpO2:  [99 %] 99 % (08/06 1709) Gen: A&Ox3, NAD CV: RRR Resp: Normal respiratory effort Abdomen: soft, NT, ND Uterus: firm, non-tender, below umbilicus Ext: No edema, no calf tenderness bilaterally, SCDs in place  Labs:  Recent Labs  04/06/16 0225 04/07/16 0531  HGB 10.3* 9.7*    A/P: Pt is a 30 y.o. [id number] s/p NSVD, PPD#1  - Pain well controlled -GU: UOP is adequate -GI: Tolerating general diet -Activity: encouraged sitting up to chair and ambulation as tolerated -Prophylaxis: early ambulation -Labs: stable as above -Anxiety: zoloft 50mg  daily with plans for early outpatient follow up  DISPO: Continue routine postpartum care, plan for discharge on PPD#2  Myna HidalgoJennifer Buffi Ewton, DO [phone] (pager) [phone] (office)

## 2016-04-07 NOTE — Lactation Note (Signed)
This note was copied from a baby's chart. Lactation Consultation Note  Patient Name: Maureen Nelson ZOXWR'UToday's Date: 04/07/2016 Reason for consult: Follow-up assessment;Other (Comment) (Requesting a curve-tipped syringe.)  Baby 19 hours old. [facility]. Baby fussy at breast, but mom reports baby has been latching well. Mom states that she will attempt again after baby settles down. Mom called out for curve-tipped syringe and has EBM at bedside in hand pump. Offered to assist mom with syringe feeding, but mom declined. Mom stated that she nursed her first child for a year without any issues. Discussed assessment with patient's bedside nurse [name], RN, who stated that she will assist with spoon feeding as needed.  Maternal Data    Feeding Feeding Type: Breast Fed Length of feed: 0 min  LATCH Score/Interventions Latch: Too sleepy or reluctant, no latch achieved, no sucking elicited. Intervention(s): Adjust position;Assist with latch;Breast compression  Audible Swallowing: None Intervention(s): Skin to skin  Type of Nipple: Flat (short shaft) Intervention(s): Hand pump  Comfort (Breast/Nipple): Soft / non-tender     Hold (Positioning): No assistance needed to correctly position infant at breast.  LATCH Score: 5  Lactation Tools Discussed/Used     Consult Status Consult Status: Follow-up Date: 04/08/16 Follow-up type: In-patient    [name], [name] 04/07/2016, 9:01 AM

## 2016-04-07 NOTE — Anesthesia Postprocedure Evaluation (Signed)
Anesthesia Post Note  Patient: Maureen Nelson  Procedure(s) Performed: * No procedures listed *  Patient location during evaluation: Mother Baby Anesthesia Type: Epidural Level of consciousness: awake and alert Pain management: pain level controlled Vital Signs Assessment: post-procedure vital signs reviewed and stable Respiratory status: spontaneous breathing and nonlabored ventilation Cardiovascular status: stable Postop Assessment: no headache, patient able to bend at knees, no backache, no signs of nausea or vomiting, epidural receding and adequate PO intake Anesthetic complications: no     Last Vitals:  Vitals:   04/06/16 2054 04/07/16 0516  BP: 124/79 (!) 116/97  Pulse: 88 74  Resp: 20 18  Temp: 36.6 C 36.3 C    Last Pain:  Vitals:   04/07/16 0456  TempSrc:   PainSc: 4    Pain Goal: Patients Stated Pain Goal: 7 (04/06/16 0255)               Laban EmperorMalinova,Othar Curto Hristova

## 2016-04-08 MED ORDER — SERTRALINE HCL 50 MG PO TABS: 50.0000 mg | ORAL_TABLET | Freq: Every day | ORAL | 11 refills | Status: DC

## 2016-04-08 MED ORDER — IBUPROFEN 600 MG PO TABS: 600.0000 mg | ORAL_TABLET | Freq: Four times a day (QID) | ORAL | 0 refills | Status: DC

## 2016-04-08 NOTE — Discharge Summary (Signed)
OB Discharge Summary     Patient Name: Maureen Nelson DOB: [birth year] MRN: [id number]  Date of admission: 04/06/2016 Delivering MD: Myna Hidalgo   Date of discharge: 04/08/2016  Admitting diagnosis: 39 WEEKS CONTRACTIONS, INCREASED PRESSURE Intrauterine pregnancy: [redacted]w[redacted]d     Secondary diagnosis:  Principal Problem:   Vaginal delivery Active Problems:   Indication for care in labor or delivery  Additional problems: none     Discharge diagnosis: Term Pregnancy Delivered                                                                                                Post partum procedures:none  Augmentation: AROM and Pitocin  Complications: None  Hospital course:  Onset of Labor With Vaginal Delivery     30 y.o. yo [id number] at [redacted]w[redacted]d was admitted in Latent Labor on 04/06/2016. Patient had an uncomplicated labor course as follows:  Membrane Rupture Time/Date: 7:40 AM ,04/06/2016   Intrapartum Procedures: Episiotomy: None [1]                                         Lacerations:     Patient had a delivery of a Viable infant. 04/06/2016  Information for the patient's newborn:  Lizette, Pazos [[id number]]  Delivery Method: Vag-Spont    Pateint had an uncomplicated postpartum course.  She is ambulating, tolerating a regular diet, passing flatus, and urinating well. Patient is discharged home in stable condition on 04/08/16.    Physical exam Vitals:   04/06/16 2054 04/07/16 0516 04/07/16 1800 04/08/16 0624  BP: 124/79 (!) 116/97 118/72 111/78  Pulse: 88 74 77 69  Resp: Temp: 97.9 F (36.6 C) 97.4 F (36.3 C) 98.2 F (36.8 C) 97.8 F (36.6 C)  TempSrc: Oral  Oral Oral  SpO2:    100%  Weight:      Height:       General: alert, cooperative and no distress Lochia: appropriate Uterine Fundus: firm Incision: N/A DVT Evaluation: No evidence of DVT seen on physical exam. Labs: Lab Results  Component Value Date   WBC 9.7 04/07/2016   HGB 9.7 (L) 04/07/2016   HCT 28.6 (L) 04/07/2016   MCV 87.7 04/07/2016   PLT 183 04/07/2016   CMP Latest Ref Rng & Units 04/03/2016  Glucose 65 - 99 mg/dL 70  BUN 6 - 20 mg/dL 8  Creatinine 1.30 - 8.65 mg/dL 7.84  Sodium 696 - 295 mmol/L 135  Potassium 3.5 - 5.1 mmol/L 3.9  Chloride 101 - 111 mmol/L 106  CO2 22 - 32 mmol/L 19(L)  Calcium 8.9 - 10.3 mg/dL 2.8(U)  Total Protein 6.5 - 8.1 g/dL 6.8  Total Bilirubin 0.3 - 1.2 mg/dL 1.0  Alkaline Phos 38 - 126 U/L 223(H)  AST 15 - 41 U/L 15  ALT 14 - 54 U/L 12(L)    Discharge instruction: per After Visit Summary and "Baby and Me Booklet".  After visit meds:    Medication List  TAKE these medications   acetaminophen 500 MG tablet Commonly known as:  TYLENOL Take 1,000 mg by mouth every 6 (six) hours as needed for mild pain or headache.   butalbital-acetaminophen-caffeine 50-325-40-30 MG capsule Commonly known as:  FIORICET WITH CODEINE Take 1 capsule by mouth every 4 (four) hours as needed for headache.   ibuprofen 600 MG tablet Commonly known as:  ADVIL,MOTRIN Take 1 tablet (600 mg total) by mouth every 6 (six) hours.   PEPCID PO Take 1 tablet by mouth daily.   prenatal multivitamin Tabs tablet Take 1 tablet by mouth at bedtime.   sertraline 50 MG tablet Commonly known as:  ZOLOFT Take 1 tablet (50 mg total) by mouth at bedtime. What changed:  medication strength  how much to take   STOOL SOFTENER PO Take 1 capsule by mouth daily.       Diet: routine diet  Activity: Advance as tolerated. Pelvic rest for 6 weeks.   Outpatient follow up:2 weeks Follow up Appt:No future appointments. Follow up Visit:No Follow-up on file.  Postpartum contraception: Progesterone only pills  Newborn Data: Live born female  Birth Weight: 7 lb 1.6 oz (3220 g) APGAR: 9, 9  Baby Feeding: Breast Disposition:home with mother   04/08/2016 Myna HidalgoZAN, Lorelle Macaluso, M, DO

## 2016-04-08 NOTE — Discharge Instructions (Signed)

## 2016-04-08 NOTE — Lactation Note (Signed)
This note was copied from a baby's chart. Lactation Consultation Note: When LC arrived in room mother in sidelying position latching infant. Mother states that infant bounces on and off for a few mins until she get on. Mother states that she is feeling breast changes. We discussed cluster feeding and cue base feeding. Suggested that mother massage breast well and ice to reduce swelling when milk comes in. Encouraged mother to rouse infant with frequent skin to skin as this will wake infant for better feedings. Advised to use breast compression. Mother receptive to all teaching. She was informed of available BFSG' and out patient dept.   Patient Name: Maureen Nelson ZOXWR'UToday's Date: 04/08/2016     Maternal Data    Feeding Feeding Type: Breast Fed  LATCH Score/Interventions                      Lactation Tools Discussed/Used     Consult Status      Maureen Nelson, Maureen Nelson 04/08/2016, 9:36 AM

## 2016-04-08 NOTE — Progress Notes (Signed)
Postpartum Note Day # 2  S:  Patient resting comfortable in bed.  Pain controlled, reports some mild cramping.  Tolerating general diet. + flatus, no BM.  Lochia appropriate.  Ambulating without difficulty.  She denies n/v/f/c, SOB, or CP.  Pt plans on breastfeeding.  O: Temp:  [97.8 F (36.6 C)-98.2 F (36.8 C)] 97.8 F (36.6 C) (08/08 0624) Pulse Rate:  [69-77] 69 (08/08 0624) Resp:  [18] 18 (08/08 0624) BP: (111-118)/(72-78) 111/78 (08/08 0624) SpO2:  [100 %] 100 % (08/08 0624) Gen: A&Ox3, NAD CV: RRR Resp: Normal respiratory effort Abdomen: soft, NT, ND Uterus: firm, non-tender, below umbilicus Ext: No edema, no calf tenderness bilaterally  Labs:   Recent Labs  04/06/16 0225 04/07/16 0531  HGB 10.3* 9.7*    A/P: Pt is a 30 y.o. [id number] s/p NSVD, PPD#2  - Pain well controlled -GU: Voiding freely -GI: Tolerating general diet -Activity: encouraged sitting up to chair and ambulation as tolerated -Prophylaxis: early ambulation -Labs: stable as above -Anxiety: zoloft 50mg  daily with plans for early outpatient follow up  DISPO: Meeting postpartum milestones appropriately, plan for discharge home today  Myna HidalgoJennifer Briell Paulette, DO [phone] (pager) [phone] (office)

## 2016-05-12 NOTE — H&P (Signed)
Maureen Nelson is a 30 y.o. female presenting for labor. Complaint of painful contractions.  Cervix changed from 2-3 to 3-4 cm while in MAU.  OB History as of 04/21/16    Gravida Para Term Preterm AB Living   2 2 2     2    SAB TAB Ectopic Multiple Live Births         0 2     Past Medical History:  Diagnosis Date  . Complication of anesthesia    Difficulty "waking up"  . Degenerative disc disease, lumbar   . Degenerative disc disease, lumbar   . Headache    Past Surgical History:  Procedure Laterality Date  . BACK SURGERY    . BREAST LUMPECTOMY    . BREAST LUMPECTOMY    . CHOLECYSTECTOMY    . SPINAL FUSION     Family History: family history includes Cancer in her maternal grandfather and paternal grandfather; Heart disease in her maternal grandmother. Social History:  reports that she has never smoked. She has never used smokeless tobacco. She reports that she does not drink alcohol or use drugs.  Review of Systems  Gastrointestinal: Positive for abdominal pain.   History Dilation: 10 Effacement (%): 50 Station: +3 Exam by:: S Earl RNC Blood pressure 111/78, pulse 69, temperature 97.8 F (36.6 C), temperature source Oral, resp. rate 18, height 5\' 7"  (1.702 m), weight 101.2 kg (223 lb), last menstrual period 06/17/2015, SpO2 100 %, unknown if currently breastfeeding. Exam Physical Exam  Per RN, pt uncomfortable.  Cervix 3-4/70% on admission.  Prenatal labs: ABO, Rh: --/--/O POS, O POS (08/06 0225) Antibody: NEG (08/06 0225) Rubella: Immune (01/09 0000) RPR: Non Reactive (08/06 0225)  HBsAg: Negative (01/09 0000)  HIV: Non-reactive (01/09 0000)  GBS: Positive (08/06 0000)   Assessment/Plan: IUP at term.  Labor GBS+  [facility] Expectant management. Start PCN per protocol. Epidural or IV pain meds prn. Dr. Charlotta Newtonzan will assume care at 7 am.   Dion BodyVARNADO, [facility] IncEVELYN

## 2016-09-30 DIAGNOSIS — B349 Viral infection, unspecified: Secondary | ICD-10-CM | POA: Diagnosis not present

## 2016-11-06 DIAGNOSIS — R062 Wheezing: Secondary | ICD-10-CM | POA: Diagnosis not present

## 2016-11-06 DIAGNOSIS — J309 Allergic rhinitis, unspecified: Secondary | ICD-10-CM | POA: Diagnosis not present

## 2017-01-18 DIAGNOSIS — H10023 Other mucopurulent conjunctivitis, bilateral: Secondary | ICD-10-CM | POA: Diagnosis not present

## 2017-05-01 ENCOUNTER — Emergency Department (HOSPITAL_COMMUNITY)
Admission: EM | Admit: 2017-05-01 | Discharge: 2017-05-01 | Disposition: A | Discharge status: Home / Self Care | Payer: 59 | Attending: Emergency Medicine | Admitting: Emergency Medicine

## 2017-05-01 ENCOUNTER — Encounter (HOSPITAL_COMMUNITY): Payer: Self-pay

## 2017-05-01 DIAGNOSIS — R1012 Left upper quadrant pain: Secondary | ICD-10-CM | POA: Insufficient documentation

## 2017-05-01 DIAGNOSIS — Z79899 Other long term (current) drug therapy: Secondary | ICD-10-CM | POA: Diagnosis not present

## 2017-05-01 DIAGNOSIS — R197 Diarrhea, unspecified: Secondary | ICD-10-CM | POA: Insufficient documentation

## 2017-05-01 DIAGNOSIS — R112 Nausea with vomiting, unspecified: Secondary | ICD-10-CM | POA: Insufficient documentation

## 2017-05-01 LAB — LIPASE, BLOOD: Lipase: 32 U/L (ref 11–51)

## 2017-05-01 LAB — CBC
HCT: 38.9 % (ref 36.0–46.0)
Hemoglobin: 13.2 g/dL (ref 12.0–15.0)
MCH: 30.8 pg (ref 26.0–34.0)
MCHC: 33.9 g/dL (ref 30.0–36.0)
MCV: 90.7 fL (ref 78.0–100.0)
PLATELETS: 265 10*3/uL (ref 150–400)
RBC: 4.29 MIL/uL (ref 3.87–5.11)
RDW: 12.4 % (ref 11.5–15.5)
WBC: 7.2 10*3/uL (ref 4.0–10.5)

## 2017-05-01 LAB — COMPREHENSIVE METABOLIC PANEL
ALK PHOS: 108 U/L (ref 38–126)
ALT: 17 U/L (ref 14–54)
AST: 19 U/L (ref 15–41)
Albumin: 3.8 g/dL (ref 3.5–5.0)
Anion gap: 8 (ref 5–15)
BILIRUBIN TOTAL: 1 mg/dL (ref 0.3–1.2)
BUN: 11 mg/dL (ref 6–20)
CALCIUM: 9.4 mg/dL (ref 8.9–10.3)
CHLORIDE: 107 mmol/L (ref 101–111)
CO2: 25 mmol/L (ref 22–32)
CREATININE: 1 mg/dL (ref 0.44–1.00)
Glucose, Bld: 80 mg/dL (ref 65–99)
Potassium: 4.3 mmol/L (ref 3.5–5.1)
Sodium: 140 mmol/L (ref 135–145)
Total Protein: 7.1 g/dL (ref 6.5–8.1)

## 2017-05-01 LAB — URINALYSIS, ROUTINE W REFLEX MICROSCOPIC
Bilirubin Urine: NEGATIVE
GLUCOSE, UA: NEGATIVE mg/dL
HGB URINE DIPSTICK: NEGATIVE
KETONES UR: NEGATIVE mg/dL
LEUKOCYTES UA: NEGATIVE
Nitrite: NEGATIVE
PROTEIN: NEGATIVE mg/dL
Specific Gravity, Urine: 1.008 (ref 1.005–1.030)
pH: 7 (ref 5.0–8.0)

## 2017-05-01 LAB — I-STAT BETA HCG BLOOD, ED (MC, WL, AP ONLY)

## 2017-05-01 MED ORDER — IBUPROFEN 600 MG PO TABS: 600.0000 mg | ORAL_TABLET | Freq: Three times a day (TID) | ORAL | 0 refills | Status: DC | PRN

## 2017-05-01 MED ORDER — ONDANSETRON HCL 4 MG PO TABS
4.0000 mg | ORAL_TABLET | Freq: Once | ORAL | Status: AC
  Administered 2017-05-01: 4 mg via ORAL
  Filled 2017-05-01: qty 1

## 2017-05-01 MED ORDER — ONDANSETRON HCL 4 MG PO TABS: 4.0000 mg | ORAL_TABLET | Freq: Four times a day (QID) | ORAL | 0 refills | Status: DC

## 2017-05-01 MED ORDER — KETOROLAC TROMETHAMINE 15 MG/ML IJ SOLN
15.0000 mg | Freq: Once | INTRAMUSCULAR | Status: AC
  Administered 2017-05-01: 15 mg via INTRAMUSCULAR
  Filled 2017-05-01: qty 1

## 2017-05-01 NOTE — ED Triage Notes (Signed)
Pt presents to the ed with complaints of pain in her left upper abdomen with mild nausea x 5 days.  Pt states this feels similar to her gallbladder but she has had it removed.

## 2017-05-01 NOTE — Discharge Instructions (Signed)
Is important that you stay well-hydrated with water. Takes Zofran as needed for nausea or vomiting. You may take Motrin as needed for pain. Do not take other anti-inflammatories at the same time. You may supplement with Tylenol if you need further pain control. Follow-up with your primary care doctor in 1 week if symptoms persist. Return to the emergency room if you develops fever, chills, persistent vomiting, or any new or worsening symptoms.

## 2017-05-01 NOTE — ED Provider Notes (Signed)
[facility]-EM[facility]ENCY DEPT Provider Note   CSN: [id number] Arrival date & time: 05/01/17  1031     History   Chief Complaint Chief Complaint  Patient presents with  . Abdominal Pain    HPI Maureen Nelson is a 31 y.o. female resenting with vomiting, diarrhea, and abdominal pain.  Patient states that for the past 3 days she's had some left upper quadrant abdominal pain. It is intermittent, worse with movement. This morning she had her first episode of emesis, nonbloody and nonbilious. Reports 2 soft stools this morning, nonbloody. She has not taken anything to help with her symptoms. She denies fever, chills, chest pain, shortness of breath, urinary symptoms. She denies numbness or tingling. No one else is sick at home. Denies recent travel. She denies any change in diet. She has no other medical problems.  She is currently breastfeeding, child is 31-year-old, and requests that medications given are safe for breast-feeding.  HPI  Past Medical History:  Diagnosis Date  . Complication of anesthesia    Difficulty "waking up"  . Degenerative disc disease, lumbar   . Degenerative disc disease, lumbar   . Headache     Patient Active Problem List   Diagnosis Date Noted  . Indication for care in labor or delivery 04/06/2016  . Vaginal delivery 04/06/2016    Past Surgical History:  Procedure Laterality Date  . BACK SURGERY    . BREAST LUMPECTOMY    . BREAST LUMPECTOMY    . CHOLECYSTECTOMY    . SPINAL FUSION      OB History    Gravida Para Term Preterm AB Living   2 2 2     2    SAB TAB Ectopic Multiple Live Births         0 2       Home Medications    Prior to Admission medications   Medication Sig Start Date End Date Taking? Authorizing Provider  acetaminophen (TYLENOL) 500 MG tablet Take 1,000 mg by mouth every 6 (six) hours as needed for mild pain or headache.    [provider]  butalbital-acetaminophen-caffeine (FIORICET WITH CODEINE) [id number] MG capsule  Take 1 capsule by mouth every 4 (four) hours as needed for headache. 04/03/16   Lorne SkeensSchenk, Nicholas Michael, MD  Docusate Calcium (STOOL SOFTENER PO) Take 1 capsule by mouth daily.    [provider]  Famotidine (PEPCID PO) Take 1 tablet by mouth daily.    [provider]  ibuprofen (ADVIL,MOTRIN) 600 MG tablet Take 1 tablet (600 mg total) by mouth every 6 (six) hours. 04/08/16   Myna Hidalgozan, Jennifer, DO  ibuprofen (ADVIL,MOTRIN) 600 MG tablet Take 1 tablet (600 mg total) by mouth every 8 (eight) hours as needed. 05/01/17   Melayna Robarts, PA-C  ondansetron (ZOFRAN) 4 MG tablet Take 1 tablet (4 mg total) by mouth every 6 (six) hours. 05/01/17   Leelan Rajewski, PA-C  Prenatal Vit-Fe Fumarate-FA (PRENATAL MULTIVITAMIN) TABS tablet Take 1 tablet by mouth at bedtime.    [provider]  sertraline (ZOLOFT) 50 MG tablet Take 1 tablet (50 mg total) by mouth at bedtime. 04/08/16   Myna Hidalgozan, Jennifer, DO    Family History Family History  Problem Relation Age of Onset  . Heart disease Maternal Grandmother   . Cancer Maternal Grandfather   . Cancer Paternal Grandfather     Social History Social History  Substance Use Topics  . Smoking status: Never Smoker  . Smokeless tobacco: Never Used  . Alcohol use No  Comment: Occasional     Allergies   Dicyclomine hcl   Review of Systems Review of Systems  Constitutional: Negative for chills and fever.  Respiratory: Negative for cough, chest tightness and shortness of breath.   Cardiovascular: Negative for chest pain.  Gastrointestinal: Positive for abdominal pain, diarrhea, nausea and vomiting. Negative for blood in stool.  Genitourinary: Negative for dysuria, frequency and hematuria.  Skin: Negative for rash and wound.  Allergic/Immunologic: Negative for immunocompromised state.  Hematological: Does not bruise/bleed easily.     Physical Exam Updated Vital Signs BP (!) 96/57 (BP Location: Right Arm)   Pulse 70   Temp 98.1  F (36.7 C) (Oral)   Resp 16   Wt 101.2 kg (223 lb)   SpO2 99%   BMI 34.93 kg/m   Physical Exam  Constitutional: She is oriented to person, place, and time. She appears well-developed and well-nourished. No distress.  Patient appears comfortable and in no apparent distress.  HENT:  Head: Normocephalic and atraumatic.  Mouth/Throat: Uvula is midline, oropharynx is clear and moist and mucous membranes are normal.  Eyes: Pupils are equal, round, and reactive to light. Conjunctivae and EOM are normal.  Neck: Normal range of motion.  Cardiovascular: Normal rate, regular rhythm and intact distal pulses.   Pulmonary/Chest: Effort normal and breath sounds normal. No respiratory distress. She has no wheezes.  Abdominal: Soft. Bowel sounds are normal. She exhibits no distension. There is tenderness in the left upper quadrant.  Mild tenderness to the left upper quadrant. No tenderness elsewhere in the abd. Negative rebound. No guarding or rigidity. Normoactive bowel sounds x4.  Musculoskeletal: Normal range of motion.  Lymphadenopathy:    She has no cervical adenopathy.  Neurological: She is alert and oriented to person, place, and time.  Skin: Skin is warm and dry.  Psychiatric: She has a normal mood and affect.  Nursing note and vitals reviewed.    ED Treatments / Results  Labs (all labs ordered are listed, but only abnormal results are displayed) Labs Reviewed  URINALYSIS, ROUTINE W REFLEX MICROSCOPIC - Abnormal; Notable for the following:       Result Value   Color, Urine STRAW (*)    All other components within normal limits  LIPASE, BLOOD  COMPREHENSIVE METABOLIC PANEL  CBC  I-STAT BETA HCG BLOOD, ED ([facility], [facility], [facility] ONLY)    EKG  EKG Interpretation None       Radiology No results found.  Procedures Procedures (including critical care time)  Medications Ordered in ED Medications  ondansetron (ZOFRAN) tablet 4 mg (4 mg Oral Given 05/01/17 1309)  ketorolac (TORADOL) 15  MG/ML injection 15 mg (15 mg Intramuscular Given 05/01/17 1309)     Initial Impression / Assessment and Plan / ED Course  I have reviewed the triage vital signs and the nursing notes.  Pertinent labs & imaging results that were available during my care of the patient were reviewed by me and considered in my medical decision making (see chart for details).     Patient presenting with 3 days of intermittent left upper quadrant abdominal pain. Nausea and diarrhea beginning today. Physical exam shows mild tenderness to this area. Vital signs reassuring, patient afebrile and not tachycardic. Will order basic labs, treat nausea, and reassess.  Labs reassuring- no elevation in lipase or white count. UA negative. Will give shot of Toradol and PO challenge. Case discussed with attending, and Dr. Freida Busman agrees to plan.   Patient's pain improved with Toradol, and tolerated  PO easily. At this time, doubt intra-abdominal infection, obstruction, perforation, or peritonitis. Discussed findings with patient. At this time, patient appears safe for discharge. She says she can follow-up with her primary care as needed. Return precautions given. Patient states she understands and agrees to plan.  Final Clinical Impressions(s) / ED Diagnoses   Final diagnoses:  Nausea vomiting and diarrhea  LUQ abdominal pain    New Prescriptions Discharge Medication List as of 05/01/2017  1:58 PM    START taking these medications   Details  !! ibuprofen (ADVIL,MOTRIN) 600 MG tablet Take 1 tablet (600 mg total) by mouth every 8 (eight) hours as needed., Starting Fri 05/01/2017, Print    ondansetron (ZOFRAN) 4 MG tablet Take 1 tablet (4 mg total) by mouth every 6 (six) hours., Starting Fri 05/01/2017, Print     !! - Potential duplicate medications found. Please discuss with provider.       Alveria Apley, PA-C 05/01/17 1633    Lorre Nick, MD 05/02/17 [phone]

## 2017-06-04 DIAGNOSIS — Z0100 Encounter for examination of eyes and vision without abnormal findings: Secondary | ICD-10-CM | POA: Diagnosis not present

## 2017-06-18 ENCOUNTER — Other Ambulatory Visit: Payer: Self-pay | Admitting: Physician Assistant

## 2017-06-18 ENCOUNTER — Ambulatory Visit
Admission: RE | Admit: 2017-06-18 | Discharge: 2017-06-18 | Disposition: A | Discharge status: Home / Self Care | Payer: 59 | Source: Ambulatory Visit | Attending: Physician Assistant | Admitting: Physician Assistant

## 2017-06-18 DIAGNOSIS — R52 Pain, unspecified: Secondary | ICD-10-CM

## 2017-06-18 DIAGNOSIS — M79662 Pain in left lower leg: Secondary | ICD-10-CM | POA: Diagnosis not present

## 2017-06-18 DIAGNOSIS — M79606 Pain in leg, unspecified: Secondary | ICD-10-CM | POA: Diagnosis not present

## 2017-06-18 DIAGNOSIS — Z01419 Encounter for gynecological examination (general) (routine) without abnormal findings: Secondary | ICD-10-CM | POA: Diagnosis not present

## 2017-07-21 DIAGNOSIS — B9620 Unspecified Escherichia coli [E. coli] as the cause of diseases classified elsewhere: Secondary | ICD-10-CM | POA: Diagnosis not present

## 2017-07-21 DIAGNOSIS — N390 Urinary tract infection, site not specified: Secondary | ICD-10-CM | POA: Diagnosis not present

## 2017-07-21 DIAGNOSIS — N10 Acute tubulo-interstitial nephritis: Secondary | ICD-10-CM | POA: Diagnosis not present

## 2017-07-21 DIAGNOSIS — R1084 Generalized abdominal pain: Secondary | ICD-10-CM | POA: Diagnosis not present

## 2017-07-21 DIAGNOSIS — R109 Unspecified abdominal pain: Secondary | ICD-10-CM | POA: Diagnosis not present

## 2017-07-26 ENCOUNTER — Encounter (HOSPITAL_COMMUNITY): Payer: Self-pay | Admitting: Emergency Medicine

## 2017-07-26 ENCOUNTER — Emergency Department (HOSPITAL_COMMUNITY)
Admission: EM | Admit: 2017-07-26 | Discharge: 2017-07-26 | Disposition: A | Discharge status: Home / Self Care | Payer: 59 | Attending: Emergency Medicine | Admitting: Emergency Medicine

## 2017-07-26 DIAGNOSIS — Z79899 Other long term (current) drug therapy: Secondary | ICD-10-CM | POA: Diagnosis not present

## 2017-07-26 DIAGNOSIS — N12 Tubulo-interstitial nephritis, not specified as acute or chronic: Secondary | ICD-10-CM | POA: Diagnosis not present

## 2017-07-26 DIAGNOSIS — R300 Dysuria: Secondary | ICD-10-CM | POA: Diagnosis not present

## 2017-07-26 LAB — BASIC METABOLIC PANEL
ANION GAP: 7 (ref 5–15)
BUN: 12 mg/dL (ref 6–20)
CHLORIDE: 108 mmol/L (ref 101–111)
CO2: 24 mmol/L (ref 22–32)
Calcium: 9.1 mg/dL (ref 8.9–10.3)
Creatinine, Ser: 1.01 mg/dL — ABNORMAL HIGH (ref 0.44–1.00)
GFR calc non Af Amer: >60 mL/min (ref >60)
Glucose, Bld: 80 mg/dL (ref 65–99)
POTASSIUM: 4.1 mmol/L (ref 3.5–5.1)
SODIUM: 139 mmol/L (ref 135–145)

## 2017-07-26 LAB — CBC WITH DIFFERENTIAL/PLATELET
BASOS ABS: 0 10*3/uL (ref 0.0–0.1)
BASOS PCT: 0 %
EOS ABS: 0.2 10*3/uL (ref 0.0–0.7)
Eosinophils Relative: 3 %
HEMATOCRIT: 39.5 % (ref 36.0–46.0)
HEMOGLOBIN: 14 g/dL (ref 12.0–15.0)
Lymphocytes Relative: 26 %
Lymphs Abs: 1.8 10*3/uL (ref 0.7–4.0)
MCH: 31.6 pg (ref 26.0–34.0)
MCHC: 35.4 g/dL (ref 30.0–36.0)
MCV: 89.2 fL (ref 78.0–100.0)
MONOS PCT: 6 %
Monocytes Absolute: 0.4 10*3/uL (ref 0.1–1.0)
NEUTROS ABS: 4.5 10*3/uL (ref 1.7–7.7)
NEUTROS PCT: 65 %
Platelets: 238 10*3/uL (ref 150–400)
RBC: 4.43 MIL/uL (ref 3.87–5.11)
RDW: 11.9 % (ref 11.5–15.5)
WBC: 7 10*3/uL (ref 4.0–10.5)

## 2017-07-26 LAB — URINALYSIS, ROUTINE W REFLEX MICROSCOPIC
Bilirubin Urine: NEGATIVE
GLUCOSE, UA: NEGATIVE mg/dL
Ketones, ur: NEGATIVE mg/dL
NITRITE: NEGATIVE
PROTEIN: NEGATIVE mg/dL
Specific Gravity, Urine: 1.017 (ref 1.005–1.030)
pH: 5 (ref 5.0–8.0)

## 2017-07-26 LAB — POC URINE PREG, ED: PREG TEST UR: NEGATIVE

## 2017-07-26 LAB — I-STAT CG4 LACTIC ACID, ED: LACTIC ACID, VENOUS: 0.75 mmol/L (ref 0.5–1.9)

## 2017-07-26 MED ORDER — METOCLOPRAMIDE HCL 5 MG/ML IJ SOLN
10.0000 mg | Freq: Once | INTRAMUSCULAR | Status: AC
  Administered 2017-07-26: 10 mg via INTRAVENOUS
  Filled 2017-07-26: qty 2

## 2017-07-26 MED ORDER — KETOROLAC TROMETHAMINE 15 MG/ML IJ SOLN
15.0000 mg | Freq: Once | INTRAMUSCULAR | Status: AC
  Administered 2017-07-26: 15 mg via INTRAVENOUS
  Filled 2017-07-26: qty 1

## 2017-07-26 MED ORDER — SODIUM CHLORIDE 0.9 % IV BOLUS (SEPSIS)
1000.0000 mL | Freq: Once | INTRAVENOUS | Status: AC
  Administered 2017-07-26: 1000 mL via INTRAVENOUS

## 2017-07-26 MED ORDER — METOCLOPRAMIDE HCL 10 MG PO TABS: 10.0000 mg | ORAL_TABLET | Freq: Four times a day (QID) | ORAL | 0 refills | Status: DC

## 2017-07-26 MED ORDER — GENTAMICIN SULFATE 40 MG/ML IJ SOLN
1.5000 mg/kg | Freq: Once | INTRAVENOUS | Status: AC
  Administered 2017-07-26: 110 mg via INTRAVENOUS
  Filled 2017-07-26: qty 2.75

## 2017-07-26 NOTE — ED Provider Notes (Addendum)
[facility] Note  CSN: [id number] Arrival date & time: 07/26/17 1127  Chief Complaint(s) Pyelonephritis  HPI Maureen Nelson is a 31 y.o. female   The history is provided by the patient.  Dysuria   This is a new problem. Episode onset: 6 days. The problem occurs every urination. The problem has been gradually worsening. The quality of the pain is described as burning and aching. The pain is moderate. There has been no fever. Associated symptoms include nausea, vomiting, frequency, urgency and flank pain. Pertinent negatives include no chills, no discharge and no possible pregnancy. Her past medical history does not include kidney stones.   Given Cipro by Urology 5 days ago. Called today stating that she was on the wrong Abx after culture speciated.   Past Medical History Past Medical History:  Diagnosis Date  . Complication of anesthesia    Difficulty "waking up"  . Degenerative disc disease, lumbar   . Degenerative disc disease, lumbar   . Headache    Patient Active Problem List   Diagnosis Date Noted  . Indication for care in labor or delivery 04/06/2016  . Vaginal delivery 04/06/2016   Home Medication(s) Prior to Admission medications   Medication Sig Start Date End Date Taking? Authorizing Provider  acetaminophen (TYLENOL) 500 MG tablet Take 1,000 mg by mouth every 6 (six) hours as needed for mild pain or headache.   Yes [provider]  escitalopram (LEXAPRO) 20 MG tablet Take 20 mg by mouth daily. 07/18/17  Yes [provider]  ondansetron (ZOFRAN) 4 MG tablet Take 1 tablet (4 mg total) by mouth every 6 (six) hours. 05/01/17  Yes Caccavale, Sophia, PA-C  butalbital-acetaminophen-caffeine (FIORICET WITH CODEINE) 50-325-40-30 MG capsule Take 1 capsule by mouth every 4 (four) hours as needed for headache. Patient not taking: Reported on 07/26/2017 04/03/16   Lorne Skeens, MD  ibuprofen (ADVIL,MOTRIN) 600  MG tablet Take 1 tablet (600 mg total) by mouth every 6 (six) hours. Patient not taking: Reported on 07/26/2017 04/08/16   Myna Hidalgo, DO  ibuprofen (ADVIL,MOTRIN) 600 MG tablet Take 1 tablet (600 mg total) by mouth every 8 (eight) hours as needed. Patient not taking: Reported on 07/26/2017 05/01/17   Caccavale, Sophia, PA-C  sertraline (ZOLOFT) 50 MG tablet Take 1 tablet (50 mg total) by mouth at bedtime. Patient not taking: Reported on 07/26/2017 04/08/16   Myna Hidalgo, DO                                                                                                                                    Past Surgical History Past Surgical History:  Procedure Laterality Date  . BACK SURGERY    . BREAST LUMPECTOMY    . BREAST LUMPECTOMY    . CHOLECYSTECTOMY    . SPINAL FUSION     Family History Family History  Problem Relation Age of Onset  . Heart  disease Maternal Grandmother   . Cancer Maternal Grandfather   . Cancer Paternal Grandfather     Social History Social History   Tobacco Use  . Smoking status: Never Smoker  . Smokeless tobacco: Never Used  Substance Use Topics  . Alcohol use: No    Comment: Occasional  . Drug use: No   Allergies Dicyclomine hcl  Review of Systems Review of Systems  Constitutional: Negative for chills.  Gastrointestinal: Positive for nausea and vomiting.  Genitourinary: Positive for dysuria, flank pain, frequency and urgency.   All other systems are reviewed and are negative for acute change except as noted in the HPI  Physical Exam Vital Signs  I have reviewed the triage vital signs BP 113/89 (BP Location: Left Arm)   Pulse 97   Temp 97.9 F (36.6 C) (Oral)   Resp 18   Ht 5\' 7"  (1.702 m)   Wt 90.7 kg (200 lb)   SpO2 97%   BMI 31.32 kg/m   Physical Exam  Constitutional: She is oriented to person, place, and time. She appears well-developed and well-nourished. No distress.  HENT:  Head: Normocephalic and atraumatic.  Nose:  Nose normal.  Eyes: Conjunctivae and EOM are normal. Pupils are equal, round, and reactive to light. Right eye exhibits no discharge. Left eye exhibits no discharge. No scleral icterus.  Neck: Normal range of motion. Neck supple.  Cardiovascular: Normal rate and regular rhythm. Exam reveals no gallop and no friction rub.  No murmur heard. Pulmonary/Chest: Effort normal and breath sounds normal. No stridor. No respiratory distress. She has no rales.  Abdominal: Soft. She exhibits no distension. There is tenderness in the suprapubic area and left lower quadrant. There is CVA tenderness (left). There is no rigidity, no guarding and negative Murphy's sign.  Musculoskeletal: She exhibits no edema or tenderness.  Neurological: She is alert and oriented to person, place, and time.  Skin: Skin is warm and dry. No rash noted. She is not diaphoretic. No erythema.  Psychiatric: She has a normal mood and affect.  Vitals reviewed.   ED Results and Treatments Labs (all labs ordered are listed, but only abnormal results are displayed) Labs Reviewed  URINALYSIS, ROUTINE W REFLEX MICROSCOPIC - Abnormal; Notable for the following components:      Result Value   APPearance HAZY (*)    Hgb urine dipstick SMALL (*)    Leukocytes, UA LARGE (*)    Bacteria, UA RARE (*)    Squamous Epithelial / LPF 0-5 (*)    Non Squamous Epithelial 0-5 (*)    All other components within normal limits  BASIC METABOLIC PANEL - Abnormal; Notable for the following components:   Creatinine, Ser 1.01 (*)    All other components within normal limits  CBC WITH DIFFERENTIAL/PLATELET  POC URINE PREG, ED  I-STAT CG4 LACTIC ACID, ED  EKG  EKG Interpretation  Date/Time:    Ventricular Rate:    PR Interval:    QRS Duration:   QT Interval:    QTC Calculation:   R Axis:     Text Interpretation:         Radiology No results found. Pertinent labs & imaging results that were available during my care of the patient were reviewed by me and considered in my medical decision making (see chart for details).  Medications Ordered in ED Medications  sodium chloride 0.9 % bolus 1,000 mL (0 mLs Intravenous Stopped 07/26/17 1550)  metoCLOPramide (REGLAN) injection 10 mg (10 mg Intravenous Given 07/26/17 1252)  gentamicin (GARAMYCIN) 110 mg in dextrose 5 % 50 mL IVPB (0 mg/kg  73.2 kg (Adjusted) Intravenous Stopped 07/26/17 1550)  ketorolac (TORADOL) 15 MG/ML injection 15 mg (15 mg Intravenous Given 07/26/17 1551)                                                                                                                                    Procedures Procedures  (including critical care time)  Medical Decision Making / ED Course I have reviewed the nursing notes for this encounter and the patient's prior records (if available in EHR or on provided paperwork).  Clinical Course as of Jul 26 1552  Wynelle LinkSun Jul 26, 2017  1416 Patient with symptoms consistent with urinary tract infection/pyelonephritis.  Labs without leukocytosis or elevated lactic acid concerning for severe sepsis.   I consulted Dr. Sande BrothersWinters who informed me that the patient's urine culture was positive for multidrug-resistant E. coli that was susceptible to Bactrim and gentamicin.  Patient treated symptomatically with antiemetics, IV fluids.   dose of gentamicin IV was provided.  Feel the patient is appropriate to continue outpatient management as she is tolerating oral hydration.   [PC]  1550 Patient tolerating oral hydration. Pt already has Rx for bactrim that was called in by Urology.  The patient is safe for discharge with strict return precautions.   [PC]    Clinical Course User Index [PC] Cardama, Amadeo GarnetPedro Eduardo, MD     Final Clinical Impression(s) / ED Diagnoses Final diagnoses:  Pyelonephritis    Disposition:  Discharge  Condition: Good  I have discussed the results, Dx and Tx plan with the patient who expressed understanding and agree(s) with the plan. Discharge instructions discussed at great length. The patient was given strict return precautions who verbalized understanding of the instructions. No further questions at time of discharge.    ED Discharge Orders    None       Follow Up: Urology  Schedule an appointment as soon as possible for a visit  As needed  Redmon, Noelle, PA-C [address] [place] [address] [phone]  Schedule an appointment as soon as possible for a visit  If symptoms do not improve or  worsen     This chart  was dictated using voice recognition software.  Despite best efforts to proofread,  errors can occur which can change the documentation meaning.     Nira Conn, MD 07/26/17 [phone]

## 2017-07-26 NOTE — ED Triage Notes (Signed)
Patient reports being diagnosed with kidney infection Wednesday at [facility] and starting on antibiotics. Pt reports feeling worse and called [facility] this am and are going to change her antibiotics after looking at her cultures. Pt been on cirpo for past week. Also c/o hematuria and emesis.

## 2017-07-26 NOTE — Discharge Instructions (Signed)
Abdominal (belly)/flank pain can be caused by many things. Your caregiver performed an examination and possibly ordered blood/urine tests and imaging (CT scan, x-rays, ultrasound). Many cases can be observed and treated at home after initial evaluation in the emergency department. Even though you are being discharged home, abdominal pain can be unpredictable. Therefore, you need a repeated exam if your pain does not resolve, returns, or worsens. Most patients with abdominal pain don't have to be admitted to the hospital or have surgery, but serious problems like appendicitis and gallbladder attacks can start out as nonspecific pain. Many abdominal conditions cannot be diagnosed in one visit, so follow-up evaluations are very important. SEEK IMMEDIATE MEDICAL ATTENTION IF: The pain does not go away or becomes severe.  A temperature above 101 develops.  Repeated vomiting occurs (multiple episodes).  The pain becomes localized to portions of the abdomen. The right side could possibly be appendicitis. In an adult, the left lower portion of the abdomen could be colitis or diverticulitis.  Blood is being passed in stools or vomit (bright red or black tarry stools).  Return also if you develop chest pain, difficulty breathing, dizziness or fainting, or become confused, poorly responsive, or inconsolable (young children).

## 2017-08-26 DIAGNOSIS — M0230 Reiter's disease, unspecified site: Secondary | ICD-10-CM | POA: Diagnosis not present

## 2017-08-26 DIAGNOSIS — R05 Cough: Secondary | ICD-10-CM | POA: Diagnosis not present

## 2018-01-25 ENCOUNTER — Emergency Department (HOSPITAL_COMMUNITY): Payer: 59

## 2018-01-25 ENCOUNTER — Encounter (HOSPITAL_COMMUNITY): Payer: Self-pay

## 2018-01-25 ENCOUNTER — Inpatient Hospital Stay (HOSPITAL_COMMUNITY)
Admission: EM | Admit: 2018-01-25 | Discharge: 2018-01-28 | DRG: 392 | Disposition: A | Discharge status: Home / Self Care | Payer: 59 | Attending: Internal Medicine | Admitting: Internal Medicine

## 2018-01-25 ENCOUNTER — Encounter (HOSPITAL_COMMUNITY): Payer: Self-pay | Admitting: *Deleted

## 2018-01-25 ENCOUNTER — Other Ambulatory Visit: Payer: Self-pay

## 2018-01-25 ENCOUNTER — Ambulatory Visit (HOSPITAL_COMMUNITY)
Admission: EM | Admit: 2018-01-25 | Discharge: 2018-01-25 | Disposition: A | Discharge status: Home / Self Care | Payer: 59 | Source: Home / Self Care | Attending: Family Medicine | Admitting: Family Medicine

## 2018-01-25 DIAGNOSIS — R109 Unspecified abdominal pain: Secondary | ICD-10-CM | POA: Diagnosis not present

## 2018-01-25 DIAGNOSIS — Z6832 Body mass index (BMI) 32.0-32.9, adult: Secondary | ICD-10-CM

## 2018-01-25 DIAGNOSIS — F411 Generalized anxiety disorder: Secondary | ICD-10-CM | POA: Diagnosis present

## 2018-01-25 DIAGNOSIS — R000 Tachycardia, unspecified: Secondary | ICD-10-CM | POA: Diagnosis present

## 2018-01-25 DIAGNOSIS — Z8719 Personal history of other diseases of the digestive system: Secondary | ICD-10-CM

## 2018-01-25 DIAGNOSIS — E876 Hypokalemia: Secondary | ICD-10-CM | POA: Diagnosis present

## 2018-01-25 DIAGNOSIS — K529 Noninfective gastroenteritis and colitis, unspecified: Secondary | ICD-10-CM | POA: Diagnosis not present

## 2018-01-25 DIAGNOSIS — D72829 Elevated white blood cell count, unspecified: Secondary | ICD-10-CM | POA: Diagnosis present

## 2018-01-25 DIAGNOSIS — Z881 Allergy status to other antibiotic agents status: Secondary | ICD-10-CM

## 2018-01-25 DIAGNOSIS — Z8249 Family history of ischemic heart disease and other diseases of the circulatory system: Secondary | ICD-10-CM

## 2018-01-25 DIAGNOSIS — Z9049 Acquired absence of other specified parts of digestive tract: Secondary | ICD-10-CM

## 2018-01-25 DIAGNOSIS — R1031 Right lower quadrant pain: Secondary | ICD-10-CM | POA: Diagnosis not present

## 2018-01-25 DIAGNOSIS — Z981 Arthrodesis status: Secondary | ICD-10-CM

## 2018-01-25 DIAGNOSIS — A09 Infectious gastroenteritis and colitis, unspecified: Principal | ICD-10-CM | POA: Diagnosis present

## 2018-01-25 DIAGNOSIS — E669 Obesity, unspecified: Secondary | ICD-10-CM | POA: Diagnosis present

## 2018-01-25 LAB — COMPREHENSIVE METABOLIC PANEL
ALK PHOS: 98 U/L (ref 38–126)
ALT: 22 U/L (ref 14–54)
AST: 19 U/L (ref 15–41)
Albumin: 4.2 g/dL (ref 3.5–5.0)
Anion gap: 8 (ref 5–15)
BUN: 11 mg/dL (ref 6–20)
CALCIUM: 9.2 mg/dL (ref 8.9–10.3)
CHLORIDE: 107 mmol/L (ref 101–111)
CO2: 24 mmol/L (ref 22–32)
CREATININE: 1.02 mg/dL — AB (ref 0.44–1.00)
Glucose, Bld: 92 mg/dL (ref 65–99)
Potassium: 4.2 mmol/L (ref 3.5–5.1)
Sodium: 139 mmol/L (ref 135–145)
TOTAL PROTEIN: 7.4 g/dL (ref 6.5–8.1)
Total Bilirubin: 1.1 mg/dL (ref 0.3–1.2)

## 2018-01-25 LAB — URINALYSIS, ROUTINE W REFLEX MICROSCOPIC
BILIRUBIN URINE: NEGATIVE
GLUCOSE, UA: NEGATIVE mg/dL
Hgb urine dipstick: NEGATIVE
KETONES UR: NEGATIVE mg/dL
Nitrite: NEGATIVE
PH: 5 (ref 5.0–8.0)
Protein, ur: NEGATIVE mg/dL
Specific Gravity, Urine: >1.046 — ABNORMAL HIGH (ref 1.005–1.030)

## 2018-01-25 LAB — CBC
HCT: 43.2 % (ref 36.0–46.0)
Hemoglobin: 14.9 g/dL (ref 12.0–15.0)
MCH: 30.2 pg (ref 26.0–34.0)
MCHC: 34.5 g/dL (ref 30.0–36.0)
MCV: 87.6 fL (ref 78.0–100.0)
PLATELETS: 270 10*3/uL (ref 150–400)
RBC: 4.93 MIL/uL (ref 3.87–5.11)
RDW: 12 % (ref 11.5–15.5)
WBC: 12.4 10*3/uL — AB (ref 4.0–10.5)

## 2018-01-25 LAB — I-STAT CG4 LACTIC ACID, ED
LACTIC ACID, VENOUS: 1.1 mmol/L (ref 0.5–1.9)
Lactic Acid, Venous: 0.72 mmol/L (ref 0.5–1.9)

## 2018-01-25 LAB — WET PREP, GENITAL
Clue Cells Wet Prep HPF POC: NONE SEEN
Sperm: NONE SEEN
TRICH WET PREP: NONE SEEN
YEAST WET PREP: NONE SEEN

## 2018-01-25 LAB — I-STAT BETA HCG BLOOD, ED (MC, WL, AP ONLY): I-stat hCG, quantitative: <5 m[IU]/mL (ref <5)

## 2018-01-25 LAB — POCT PREGNANCY, URINE: Preg Test, Ur: NEGATIVE

## 2018-01-25 LAB — LIPASE, BLOOD: Lipase: 30 U/L (ref 11–51)

## 2018-01-25 MED ORDER — MORPHINE SULFATE (PF) 4 MG/ML IV SOLN
4.0000 mg | Freq: Once | INTRAVENOUS | Status: AC
  Administered 2018-01-25: 4 mg via INTRAVENOUS
  Filled 2018-01-25: qty 1

## 2018-01-25 MED ORDER — SODIUM CHLORIDE 0.9 % IV BOLUS
500.0000 mL | Freq: Once | INTRAVENOUS | Status: AC
  Administered 2018-01-25: 500 mL via INTRAVENOUS

## 2018-01-25 MED ORDER — OXYCODONE-ACETAMINOPHEN 5-325 MG PO TABS
1.0000 | ORAL_TABLET | Freq: Once | ORAL | Status: AC
  Administered 2018-01-25: 1 via ORAL
  Filled 2018-01-25: qty 1

## 2018-01-25 MED ORDER — DEXTROSE-NACL 5-0.45 % IV SOLN
INTRAVENOUS | Status: DC
  Administered 2018-01-25 – 2018-01-27 (×4): via INTRAVENOUS

## 2018-01-25 MED ORDER — METRONIDAZOLE IN NACL 5-0.79 MG/ML-% IV SOLN
500.0000 mg | Freq: Three times a day (TID) | INTRAVENOUS | Status: DC
  Administered 2018-01-25 – 2018-01-28 (×8): 500 mg via INTRAVENOUS
  Filled 2018-01-25 (×9): qty 100

## 2018-01-25 MED ORDER — ONDANSETRON HCL 4 MG/2ML IJ SOLN
4.0000 mg | Freq: Four times a day (QID) | INTRAMUSCULAR | Status: DC | PRN
  Administered 2018-01-25 – 2018-01-26 (×4): 4 mg via INTRAVENOUS
  Filled 2018-01-25 (×4): qty 2

## 2018-01-25 MED ORDER — IOHEXOL 300 MG/ML  SOLN
100.0000 mL | Freq: Once | INTRAMUSCULAR | Status: AC | PRN
  Administered 2018-01-25: 100 mL via INTRAVENOUS

## 2018-01-25 MED ORDER — ENOXAPARIN SODIUM 40 MG/0.4ML ~~LOC~~ SOLN
40.0000 mg | SUBCUTANEOUS | Status: DC
  Administered 2018-01-25 – 2018-01-27 (×3): 40 mg via SUBCUTANEOUS
  Filled 2018-01-25 (×3): qty 0.4

## 2018-01-25 MED ORDER — SODIUM CHLORIDE 0.9 % IV BOLUS
1000.0000 mL | Freq: Once | INTRAVENOUS | Status: AC
  Administered 2018-01-25: 1000 mL via INTRAVENOUS

## 2018-01-25 MED ORDER — CIPROFLOXACIN IN D5W 400 MG/200ML IV SOLN
400.0000 mg | Freq: Two times a day (BID) | INTRAVENOUS | Status: DC
  Administered 2018-01-25 – 2018-01-27 (×5): 400 mg via INTRAVENOUS
  Filled 2018-01-25 (×6): qty 200

## 2018-01-25 MED ORDER — ONDANSETRON HCL 4 MG/2ML IJ SOLN
4.0000 mg | Freq: Once | INTRAMUSCULAR | Status: AC
  Administered 2018-01-25: 4 mg via INTRAVENOUS
  Filled 2018-01-25: qty 2

## 2018-01-25 MED ORDER — NORETHINDRONE 0.35 MG PO TABS: 1.0000 | ORAL_TABLET | Freq: Every day | ORAL | Status: DC

## 2018-01-25 MED ORDER — MORPHINE SULFATE (PF) 4 MG/ML IV SOLN
2.0000 mg | INTRAVENOUS | Status: DC | PRN
  Administered 2018-01-25 – 2018-01-26 (×2): 2 mg via INTRAVENOUS
  Filled 2018-01-25 (×3): qty 1

## 2018-01-25 MED ORDER — ONDANSETRON HCL 4 MG PO TABS
4.0000 mg | ORAL_TABLET | Freq: Four times a day (QID) | ORAL | Status: DC | PRN
  Administered 2018-01-27 – 2018-01-28 (×3): 4 mg via ORAL
  Filled 2018-01-25 (×3): qty 1

## 2018-01-25 NOTE — ED Notes (Signed)
Patient transported to CT 

## 2018-01-25 NOTE — Progress Notes (Signed)
Admitting MD at bedside.

## 2018-01-25 NOTE — ED Provider Notes (Signed)
[facility]    CSN: [id number] Arrival date & time: 01/25/18  0957     History   Chief Complaint Chief Complaint  Patient presents with  . Abdominal Pain    HPI Maureen Nelson is a 32 y.o. female.   HPI  Patient is here for acute abdominal pain.  She was awakened in the middle of the night with pain.  It is in the periumbilical and right side.  She has nausea but no vomiting.  No fever or chills.  She status post cholecystectomy.  She had no different food, no known food intolerance.  She had a couple loose bowels today.  No sweats chills or fever.  The pain is "severe".   Past Medical History:  Diagnosis Date  . Complication of anesthesia    Difficulty "waking up"  . Degenerative disc disease, lumbar   . Degenerative disc disease, lumbar   . Headache     Patient Active Problem List   Diagnosis Date Noted  . Indication for care in labor or delivery 04/06/2016  . Vaginal delivery 04/06/2016    Past Surgical History:  Procedure Laterality Date  . BACK SURGERY    . BREAST LUMPECTOMY    . BREAST LUMPECTOMY    . CHOLECYSTECTOMY    . SPINAL FUSION      OB History    Gravida  2   Para  2   Term  2   Preterm      AB      Living  2     SAB      TAB      Ectopic      Multiple  0   Live Births  2            Home Medications    Prior to Admission medications   Medication Sig Start Date End Date Taking? Authorizing Provider  UNKNOWN TO PATIENT BCPs   Yes [provider]  acetaminophen (TYLENOL) 500 MG tablet Take 1,000 mg by mouth every 6 (six) hours as needed for mild pain or headache.    [provider]    Family History Family History  Problem Relation Age of Onset  . Heart disease Maternal Grandmother   . Cancer Maternal Grandfather   . Cancer Paternal Grandfather     Social History Social History   Tobacco Use  . Smoking status: Never Smoker  . Smokeless tobacco: Never Used  Substance Use Topics    . Alcohol use: No    Comment: Occasional  . Drug use: No     Allergies   Dicyclomine hcl   Review of Systems Review of Systems  Constitutional: Negative for chills and fever.  HENT: Negative for ear pain and sore throat.   Eyes: Negative for pain and visual disturbance.  Respiratory: Negative for cough and shortness of breath.   Cardiovascular: Negative for chest pain and palpitations.  Gastrointestinal: Positive for abdominal pain and nausea. Negative for vomiting.  Genitourinary: Negative for dysuria and hematuria.  Musculoskeletal: Negative for arthralgias and back pain.  Skin: Negative for color change and rash.  Neurological: Negative for seizures and syncope.  All other systems reviewed and are negative.    Physical Exam Triage Vital Signs ED Triage Vitals  Enc Vitals Group     BP 01/25/18 1017 135/82     Pulse Rate 01/25/18 1017 88     Resp 01/25/18 1017 18     Temp 01/25/18 1017 98.1 F (  36.7 C)     Temp Source 01/25/18 1017 Oral     SpO2 01/25/18 1017 98 %     Weight --      Height --      Head Circumference --      Peak Flow --      Pain Score 01/25/18 1019 6     Pain Loc --      Pain Edu? --      Excl. in GC? --    No data found.  Updated Vital Signs BP 135/82   Pulse 88   Temp 98.1 F (36.7 C) (Oral)   Resp 18   SpO2 98%   Breastfeeding? Yes   Visual Acuity Right Eye Distance:   Left Eye Distance:   Bilateral Distance:    Right Eye Near:   Left Eye Near:    Bilateral Near:     Physical Exam  Constitutional: She appears well-developed and well-nourished. No distress.  HENT:  Head: Normocephalic and atraumatic.  Mouth/Throat: Oropharynx is clear and moist.  Eyes: Pupils are equal, round, and reactive to light. Conjunctivae are normal.  Neck: Normal range of motion.  Cardiovascular: Normal rate.  Pulmonary/Chest: Effort normal. No respiratory distress.  Abdominal: Soft. Bowel sounds are normal. She exhibits no distension. There is  no hepatosplenomegaly. There is tenderness in the right lower quadrant and periumbilical area. There is rebound and guarding.  Musculoskeletal: Normal range of motion. She exhibits no edema.  Neurological: She is alert.  Skin: Skin is warm and dry.     UC Treatments / Results  Labs (all labs ordered are listed, but only abnormal results are displayed) Labs Reviewed  POCT PREGNANCY, URINE    EKG None  Radiology No results found.  Procedures Procedures (including critical care time)  Medications Ordered in UC Medications - No data to display  Initial Impression / Assessment and Plan / UC Course  I have reviewed the triage vital signs and the nursing notes.  Pertinent labs & imaging results that were available during my care of the patient were reviewed by me and considered in my medical decision making (see chart for details).     Discussed with patient that she has acute abdominal pain with rebound.  I am worried that she could have appendicitis or another abdominal process that requires more evaluation that I can provide at the urgent care center.  She is being transferred to the ER.  She degrees. Final Clinical Impressions(s) / UC Diagnoses   Final diagnoses:  Acute abdominal pain     Discharge Instructions     TO ER   ED Prescriptions    None     Controlled Substance Prescriptions [place] Controlled Substance Registry consulted? Not Applicable   Eustace Moore, MD 01/25/18 1056

## 2018-01-25 NOTE — ED Notes (Signed)
Patient transported to Ultrasound 

## 2018-01-25 NOTE — ED Notes (Signed)
ED Provider at bedside. 

## 2018-01-25 NOTE — ED Provider Notes (Signed)
[facility] EMERGENCY DEPARTMENT Provider Note   CSN: [id number] Arrival date & time: 01/25/18  1057     History   Chief Complaint Chief Complaint  Patient presents with  . Abdominal Pain    HPI Maureen Nelson is a 32 y.o. female.  HPI  Pt is a 31 y/o F with a h/o DDD, HA, cholecystectomy, who presents to the ED to be evaluated for abd pain that began suddenly around 3 AM this morning.  Patient states that pain initially was intermittent however has become constant in the right lower quadrant.  States pain is 7/10.  She feels nauseated and has had several episodes of diarrhea this morning.  No blood in her stool.  She has had decreased appetite.  She was seen in urgent care prior to arrival for evaluation and was sent here due to concern for appendicitis for the versus other acute abdominal process.  States that she had increased pain with bumpiness in the road on her transport to the ED.  She denies any constipation, fevers, dysuria, hematuria, frequency, urgency, vaginal bleeding, vaginal discharge.  She has no suprapubic abdominal pain.  She has not tried taking any medications for her symptoms.  Past Medical History:  Diagnosis Date  . Complication of anesthesia    Difficulty "waking up"  . Degenerative disc disease, lumbar   . Degenerative disc disease, lumbar   . Headache     Patient Active Problem List   Diagnosis Date Noted  . Distal Enteritis 01/25/2018  . Leucocytosis 01/25/2018  . Indication for care in labor or delivery 04/06/2016  . Vaginal delivery 04/06/2016    Past Surgical History:  Procedure Laterality Date  . BACK SURGERY    . BREAST LUMPECTOMY    . BREAST LUMPECTOMY    . CHOLECYSTECTOMY    . SPINAL FUSION       OB History    Gravida  2   Para  2   Term  2   Preterm      AB      Living  2     SAB      TAB      Ectopic      Multiple  0   Live Births  2            Home Medications    Prior to  Admission medications   Medication Sig Start Date End Date Taking? Authorizing Provider  acetaminophen (TYLENOL) 500 MG tablet Take 1,000 mg by mouth every 6 (six) hours as needed for mild pain or headache.   Yes [provider]  albuterol (PROVENTIL HFA;VENTOLIN HFA) 108 (90 Base) MCG/ACT inhaler Inhale 1-2 puffs into the lungs every 6 (six) hours as needed for wheezing or shortness of breath.   Yes [provider]  norethindrone (MICRONOR,CAMILA,ERRIN) 0.35 MG tablet Take 1 tablet by mouth at bedtime.  01/23/18  Yes [provider]    Family History Family History  Problem Relation Age of Onset  . Heart disease Maternal Grandmother   . Cancer Maternal Grandfather   . Cancer Paternal Grandfather     Social History Social History   Tobacco Use  . Smoking status: Never Smoker  . Smokeless tobacco: Never Used  Substance Use Topics  . Alcohol use: No    Comment: Occasional  . Drug use: No     Allergies   Dicyclomine hcl   Review of Systems Review of Systems  Constitutional: Negative for fever.  HENT: Negative for congestion.   Respiratory: Negative for shortness of breath.   Cardiovascular: Negative for chest pain.  Gastrointestinal: Positive for abdominal pain, diarrhea and nausea. Negative for blood in stool, constipation and vomiting.  Genitourinary: Negative for decreased urine volume, dysuria, flank pain, frequency, hematuria, pelvic pain, urgency, vaginal bleeding and vaginal discharge.  Musculoskeletal: Negative for neck pain.  Skin: Negative for wound.  Neurological: Negative for headaches.    Physical Exam Updated Vital Signs BP 112/71 (BP Location: Left Arm)   Pulse 77   Temp 98.1 F (36.7 C) (Oral)   Resp 18   Ht  (1.702 m)   Wt 93.3 kg (205 lb 11 oz)   SpO2 98%   BMI 32.22 kg/m   Physical Exam  Constitutional: She appears well-developed and well-nourished.  Patient in distress.  HENT:  Head: Normocephalic and  atraumatic.  Mouth/Throat: Oropharynx is clear and moist.  Eyes: Conjunctivae are normal. No scleral icterus.  Neck: Neck supple.  Cardiovascular: Normal rate and normal heart sounds.  No murmur heard. tachycardic  Pulmonary/Chest: Effort normal and breath sounds normal. No respiratory distress. She has no wheezes. She has no rales.  Abdominal: Soft. Bowel sounds are normal. There is tenderness at McBurney's point. There is no CVA tenderness and negative Murphy's sign.  Point TTP to the RLQ with rebound. No rigidity or guarding. No suprapubic TTP.  Genitourinary:  Genitourinary Comments: Exam performed by Karrie Meres,  exam chaperoned Date: 01/25/2018 Pelvic exam: normal external genitalia without evidence of trauma. VULVA: normal appearing vulva with no masses, tenderness or lesion. VAGINA: normal appearing vagina with normal color and discharge, no lesions. CERVIX: mild erythema to cervix with mild white/yellow discharge, no cervical motion tenderness, cervical os closed, Wet prep and DNA probe for chlamydia and GC obtained.   ADNEXA: normal adnexa in size, nontender and no masses UTERUS: uterus is normal size, shape, consistency and nontender.   Musculoskeletal: She exhibits no edema.  Neurological: She is alert.  Skin: Skin is warm and dry. Capillary refill takes less than 2 seconds.  Psychiatric: She has a normal mood and affect.  Nursing note and vitals reviewed.  ED Treatments / Results  Labs (all labs ordered are listed, but only abnormal results are displayed) Labs Reviewed  WET PREP, GENITAL - Abnormal; Notable for the following components:      Result Value   WBC, Wet Prep HPF POC MANY (*)    All other components within normal limits  COMPREHENSIVE METABOLIC PANEL - Abnormal; Notable for the following components:   Creatinine, Ser 1.02 (*)    All other components within normal limits  CBC - Abnormal; Notable for the following components:   WBC 12.4 (*)    All  other components within normal limits  URINALYSIS, ROUTINE W REFLEX MICROSCOPIC - Abnormal; Notable for the following components:   Color, Urine STRAW (*)    Specific Gravity, Urine >1.046 (*)    Leukocytes, UA SMALL (*)    Bacteria, UA RARE (*)    All other components within normal limits  URINE CULTURE  LIPASE, BLOOD  HIV ANTIBODY (ROUTINE TESTING)  COMPREHENSIVE METABOLIC PANEL  CBC  I-STAT BETA HCG BLOOD, ED ([facility], [facility], [facility] ONLY)  I-STAT CG4 LACTIC ACID, ED  I-STAT CG4 LACTIC ACID, ED  I-STAT CG4 LACTIC ACID, ED  GC/CHLAMYDIA PROBE AMP ([place]) NOT AT [facility]    EKG None  Radiology US Transvaginal Non-ob  Result Date: 01/25/2018 CLINICAL DATA:  Mid abdominal  pain EXAM: TRANSABDOMINAL AND TRANSVAGINAL ULTRASOUND OF PELVIS DOPPLER ULTRASOUND OF OVARIES TECHNIQUE: Both transabdominal and transvaginal ultrasound examinations of the pelvis were performed. Transabdominal technique was performed for global imaging of the pelvis including uterus, ovaries, adnexal regions, and pelvic cul-de-sac. It was necessary to proceed with endovaginal exam following the transabdominal exam to visualize the endometrium and ovaries. Color and duplex Doppler ultrasound was utilized to evaluate blood flow to the ovaries. COMPARISON:  None. FINDINGS: Uterus Measurements: 8.4 x 3.6 x 4.7 cm. No fibroids or other mass visualized. Endometrium Thickness: 5.8 mm.  No focal abnormality visualized. Right ovary Measurements: 2.8 x 2.3 x 2.1 cm. Normal appearance/no adnexal mass. Left ovary Measurements: 2.8 x 1.9 x 2.1 cm. Normal appearance/no adnexal mass. Pulsed Doppler evaluation of both ovaries demonstrates normal low-resistance arterial and venous waveforms. Other findings Moderate amount of pelvic free fluid. IMPRESSION: 1. No ovarian torsion. 2. Moderate amount of pelvic free fluid. Electronically Signed   By: Elige Ko   On: 01/25/2018 16:59   US Pelvis Complete  Result Date: 01/25/2018 CLINICAL DATA:  Mid  abdominal pain EXAM: TRANSABDOMINAL AND TRANSVAGINAL ULTRASOUND OF PELVIS DOPPLER ULTRASOUND OF OVARIES TECHNIQUE: Both transabdominal and transvaginal ultrasound examinations of the pelvis were performed. Transabdominal technique was performed for global imaging of the pelvis including uterus, ovaries, adnexal regions, and pelvic cul-de-sac. It was necessary to proceed with endovaginal exam following the transabdominal exam to visualize the endometrium and ovaries. Color and duplex Doppler ultrasound was utilized to evaluate blood flow to the ovaries. COMPARISON:  None. FINDINGS: Uterus Measurements: 8.4 x 3.6 x 4.7 cm. No fibroids or other mass visualized. Endometrium Thickness: 5.8 mm.  No focal abnormality visualized. Right ovary Measurements: 2.8 x 2.3 x 2.1 cm. Normal appearance/no adnexal mass. Left ovary Measurements: 2.8 x 1.9 x 2.1 cm. Normal appearance/no adnexal mass. Pulsed Doppler evaluation of both ovaries demonstrates normal low-resistance arterial and venous waveforms. Other findings Moderate amount of pelvic free fluid. IMPRESSION: 1. No ovarian torsion. 2. Moderate amount of pelvic free fluid. Electronically Signed   By: Elige Ko   On: 01/25/2018 16:59   Ct Abdomen Pelvis W Contrast  Result Date: 01/25/2018 CLINICAL DATA:  Abdominal pain. Intermittent abdominal pain which has become constant. EXAM: CT ABDOMEN AND PELVIS WITH CONTRAST TECHNIQUE: Multidetector CT imaging of the abdomen and pelvis was performed using the standard protocol following bolus administration of intravenous contrast. CONTRAST:  OMNIPAQUE IOHEXOL 300 MG/ML  SOLN COMPARISON:  None. FINDINGS: Lower chest: No acute abnormality. Hepatobiliary: No focal liver abnormality is seen. Status post cholecystectomy. No biliary dilatation. Pancreas: Unremarkable. No pancreatic ductal dilatation or surrounding inflammatory changes. Spleen: Normal in size without focal abnormality. Adrenals/Urinary Tract: Adrenal glands are  unremarkable. Kidneys are normal, without renal calculi, focal lesion, or hydronephrosis. Bladder is unremarkable. Stomach/Bowel: Stomach is within normal limits. Appendix appears normal. No bowel dilatation to suggest obstruction. Bowel wall thickening and air-fluid levels involving the distal ileum most concerning for enteritis secondary to an infectious or inflammatory etiology. Small volume pelvic free fluid. Vascular/Lymphatic: No significant vascular findings are present. No enlarged abdominal or pelvic lymph nodes. Reproductive: Uterus and bilateral adnexa are unremarkable. Other: No abdominal wall hernia or abnormality. Musculoskeletal: No acute osseous abnormality. No aggressive osseous lesion. Posterior lumbar fusion at L5-S1. IMPRESSION: 1. Bowel wall thickening and air-fluid levels involving the distal ileum most concerning for enteritis secondary to an infectious or inflammatory etiology. Small amount of pelvic free fluid. Electronically Signed   By: Elige Ko  On: 01/25/2018 13:24   [place] Art/ven Flow Abd Pelv Doppler  Result Date: 01/25/2018 CLINICAL DATA:  Mid abdominal pain EXAM: TRANSABDOMINAL AND TRANSVAGINAL ULTRASOUND OF PELVIS DOPPLER ULTRASOUND OF OVARIES TECHNIQUE: Both transabdominal and transvaginal ultrasound examinations of the pelvis were performed. Transabdominal technique was performed for global imaging of the pelvis including uterus, ovaries, adnexal regions, and pelvic cul-de-sac. It was necessary to proceed with endovaginal exam following the transabdominal exam to visualize the endometrium and ovaries. Color and duplex Doppler ultrasound was utilized to evaluate blood flow to the ovaries. COMPARISON:  None. FINDINGS: Uterus Measurements: 8.4 x 3.6 x 4.7 cm. No fibroids or other mass visualized. Endometrium Thickness: 5.8 mm.  No focal abnormality visualized. Right ovary Measurements: 2.8 x 2.3 x 2.1 cm. Normal appearance/no adnexal mass. Left ovary Measurements: 2.8 x 1.9 x  2.1 cm. Normal appearance/no adnexal mass. Pulsed Doppler evaluation of both ovaries demonstrates normal low-resistance arterial and venous waveforms. Other findings Moderate amount of pelvic free fluid. IMPRESSION: 1. No ovarian torsion. 2. Moderate amount of pelvic free fluid. Electronically Signed   By: Elige Ko   On: 01/25/2018 16:59    Procedures Procedures (including critical care time)  Medications Ordered in ED Medications  norethindrone (MICRONOR,CAMILA,ERRIN) 0.35 MG tablet 0.35 mg (0.35 mg Oral Not Given 01/25/18 2216)  enoxaparin (LOVENOX) injection 40 mg (40 mg Subcutaneous Given 01/25/18 2219)  dextrose 5 %-0.45 % sodium chloride infusion ( Intravenous New Bag/Given 01/25/18 2219)  ondansetron (ZOFRAN) tablet 4 mg ( Oral See Alternative 01/25/18 2255)    Or  ondansetron (ZOFRAN) injection 4 mg (4 mg Intravenous Given 01/25/18 2255)  ciprofloxacin (CIPRO) IVPB 400 mg (400 mg Intravenous New Bag/Given 01/25/18 2250)  metroNIDAZOLE (FLAGYL) IVPB 500 mg (500 mg Intravenous New Bag/Given 01/25/18 2250)  morphine 4 MG/ML injection 2 mg (2 mg Intravenous Given 01/25/18 2255)  ondansetron (ZOFRAN) injection 4 mg (4 mg Intravenous Given 01/25/18 1219)  sodium chloride 0.9 % bolus 500 mL (0 mLs Intravenous Stopped 01/25/18 1322)  morphine 4 MG/ML injection 4 mg (4 mg Intravenous Given 01/25/18 1220)  iohexol (OMNIPAQUE) 300 MG/ML solution 100 mL (100 mLs Intravenous Contrast Given 01/25/18 1246)  morphine 4 MG/ML injection 4 mg (4 mg Intravenous Given 01/25/18 1327)  oxyCODONE-acetaminophen (PERCOCET/ROXICET) 5-325 MG per tablet 1 tablet (1 tablet Oral Given 01/25/18 1549)  sodium chloride 0.9 % bolus 1,000 mL (0 mLs Intravenous Stopped 01/25/18 1629)  morphine 4 MG/ML injection 4 mg (4 mg Intravenous Given 01/25/18 1901)     Initial Impression / Assessment and Plan / ED Course  I have reviewed the triage vital signs and the nursing notes.  Pertinent labs & imaging results that were available  during my care of the patient were reviewed by me and considered in my medical decision making (see chart for details).     Final Clinical Impressions(s) / ED Diagnoses   Final diagnoses:  Gastroenteritis   Patient with right lower quadrant pain beginning at 3 AM.  Initially tachycardic however afebrile and otherwise vital signs are stable.  Was sent for from urgent care to rule out appendicitis versus other acute emergent intra-abdominal pathology. Patient has point tenderness to the right lower quadrant.  No suprapubic tenderness.  Labs significant for leukocytosis, normal kidney function, normal lipase.  Negative pregnancy test negative lactic.  UA with leukocytes but no nitrites. Urine culture sent.   CT abdomen pelvis ordered to rule out appendicitis versus other intraabdominal pathology. CT abd/pelvis with evidence of gastroenteritis in distal ileum.  Patient has had multiple repeat abdominal exams with point TTP to RLQ with guarding and is still c/o severe pain pain after multiple doses of morphine and percocet. No additional episodes of diarrhea and no vomiting. Pelvic exam completed and was negative for CMT, or bilat adnexal ttp. Low concern for STD given patient married and in monogamous relationship however Gc/chlamydia obtained and pending.   Given patient pain seems severe in setting of suspected viral gastroenteris GI consulted.  6:10 PM Discussed case with Dr. Georgiann Cocker from gastroenterology, who stated that there are no further studies that need to be completed with regards to the GI standpoint however patient may need to be admitted if her pain is not under control.  He states that if patient is admitted he will be happy to consult on her.   Discussed option of admission versus discharge with pain medications and supportive therapy and patient and family at bedside prefer admission.  8:23 PM Dr. Mikeal Hawthorne will admit the patient.   ED Discharge Orders    None       Rayne Du 01/25/18 2347    Pricilla Loveless, MD 01/26/18 2252

## 2018-01-25 NOTE — Discharge Instructions (Signed)
TO ER.  

## 2018-01-25 NOTE — H&P (Signed)
History and Physical    Maureen Nelson ZOX:[id number] DOB: [birth year] DOA: 01/25/2018  Referring MD/NP/PA: Junius Roads, PA PCP: Milus Height, PA-C  Outpatient Specialists: None  Patient coming from: Home  Chief Complaint: Abdominal pain and nausea  HPI: Maureen Nelson is a 32 y.o. female with medical history significant of acute cholecystitis status post cholecystectomy, no other significant history presented to the ER was abdominal pain nausea and vomiting. Pain is rated as 9 out of 10. Periorbital black 02 right upper quadrant. Has not had anything like this before. Also several episodes of diarrhea today. Patient has had no sick contacts however has 2 young children. She is also a [employer] loss adjuster. She was evaluated and found to have distal enteritis. No prior GI symptoms. No family history of inflammatory bowel disease but mother has irritable bowel syndrome.  ED Course: Patient is afebrile. White count is 12.4 and carina 1.02. Normal electrolytes otherwise. Urinalysis is negative. Pelvic ultrasound and transvaginal ultrasound showed only rare leukocytosis on wet prep. CT abdomen and pelvis showed distal enteritis. GI was consulted and recommends medical admission with possible GI follow-up  Review of Systems: As per HPI otherwise 10 point review of systems negative.    Past Medical History:  Diagnosis Date  . Complication of anesthesia    Difficulty "waking up"  . Degenerative disc disease, lumbar   . Degenerative disc disease, lumbar   . Headache     Past Surgical History:  Procedure Laterality Date  . BACK SURGERY    . BREAST LUMPECTOMY    . BREAST LUMPECTOMY    . CHOLECYSTECTOMY    . SPINAL FUSION       reports that she has never smoked. She has never used smokeless tobacco. She reports that she does not drink alcohol or use drugs.  Allergies  Allergen Reactions  . Dicyclomine Hcl Rash    Family History  Problem Relation Age of Onset  . Heart disease  Maternal Grandmother   . Cancer Maternal Grandfather   . Cancer Paternal Grandfather     Prior to Admission medications   Medication Sig Start Date End Date Taking? Authorizing Provider  acetaminophen (TYLENOL) 500 MG tablet Take 1,000 mg by mouth every 6 (six) hours as needed for mild pain or headache.   Yes [provider]  albuterol (PROVENTIL HFA;VENTOLIN HFA) 108 (90 Base) MCG/ACT inhaler Inhale 1-2 puffs into the lungs every 6 (six) hours as needed for wheezing or shortness of breath.   Yes [provider]  norethindrone (MICRONOR,CAMILA,ERRIN) 0.35 MG tablet Take 1 tablet by mouth at bedtime.  01/23/18  Yes [provider]    Physical Exam: Vitals:   01/25/18 1845 01/25/18 1900 01/25/18 1915 01/25/18 2100  BP: 114/71 114/73 107/77 117/77  Pulse: 83 80 81 77  Resp: Temp:      TempSrc:      SpO2: 100% 100% 99% 99%      Constitutional: NAD, calm, comfortable Vitals:   01/25/18 1845 01/25/18 1900 01/25/18 1915 01/25/18 2100  BP: 114/71 114/73 107/77 117/77  Pulse: 83 80 81 77  Resp: Temp:      TempSrc:      SpO2: 100% 100% 99% 99%   Eyes: PERRL, lids and conjunctivae normal ENMT: Mucous membranes are moist. Posterior pharynx clear of any exudate or lesions.Normal dentition.  Neck: normal, supple, no masses, no thyromegaly Respiratory: clear to auscultation bilaterally, no wheezing, no crackles. Normal  respiratory effort. No accessory muscle use.  Cardiovascular: Regular rate and rhythm, no murmurs / rubs / gallops. No extremity edema. 2+ pedal pulses. No carotid bruits.  Abdomen: Diffuse tenderness, more tender in the epigastric to the right upper and lower quadrants, no masses palpated. No hepatosplenomegaly. Bowel sounds positive.  Musculoskeletal: no clubbing / cyanosis. No joint deformity upper and lower extremities. Good ROM, no contractures. Normal muscle tone.  Skin: no rashes, lesions, ulcers. No  induration Neurologic: CN 2-12 grossly intact. Sensation intact, DTR normal. Strength 5/5 in all 4.  Psychiatric: Normal judgment and insight. Alert and oriented x 3. Normal mood.   Labs on Admission: I have personally reviewed following labs and imaging studies  CBC: Recent Labs  Lab 01/25/18 1103  WBC 12.4*  HGB 14.9  HCT 43.2  MCV 87.6  PLT 270   Basic Metabolic Panel: Recent Labs  Lab 01/25/18 1103  NA 139  K 4.2  CL 107  CO2 24  GLUCOSE 92  BUN 11  CREATININE 1.02*  CALCIUM 9.2   GFR: CrCl cannot be calculated (Unknown ideal weight.). Liver Function Tests: Recent Labs  Lab 01/25/18 1103  AST 19  ALT 22  ALKPHOS 98  BILITOT 1.1  PROT 7.4  ALBUMIN 4.2   Recent Labs  Lab 01/25/18 1103  LIPASE 30   No results for input(s): AMMONIA in the last 168 hours. Coagulation Profile: No results for input(s): INR, PROTIME in the last 168 hours. Cardiac Enzymes: No results for input(s): CKTOTAL, CKMB, CKMBINDEX, TROPONINI in the last 168 hours. BNP (last 3 results) No results for input(s): PROBNP in the last 8760 hours. HbA1C: No results for input(s): HGBA1C in the last 72 hours. CBG: No results for input(s): GLUCAP in the last 168 hours. Lipid Profile: No results for input(s): CHOL, HDL, LDLCALC, TRIG, CHOLHDL, LDLDIRECT in the last 72 hours. Thyroid Function Tests: No results for input(s): TSH, T4TOTAL, FREET4, T3FREE, THYROIDAB in the last 72 hours. Anemia Panel: No results for input(s): VITAMINB12, FOLATE, FERRITIN, TIBC, IRON, RETICCTPCT in the last 72 hours. Urine analysis:    Component Value Date/Time   COLORURINE STRAW (A) 01/25/2018 1405   APPEARANCEUR CLEAR 01/25/2018 1405   LABSPEC >1.046 (H) 01/25/2018 1405   PHURINE 5.0 01/25/2018 1405   GLUCOSEU NEGATIVE 01/25/2018 1405   HGBUR NEGATIVE 01/25/2018 1405   BILIRUBINUR NEGATIVE 01/25/2018 1405   KETONESUR NEGATIVE 01/25/2018 1405   PROTEINUR NEGATIVE 01/25/2018 1405   UROBILINOGEN 0.2  08/17/2014 0023   NITRITE NEGATIVE 01/25/2018 1405   LEUKOCYTESUR SMALL (A) 01/25/2018 1405   Sepsis Labs: (procalcitonin:4,lacticidven:4) ) Recent Results (from the past 240 hour(s))  Wet prep, genital     Status: Abnormal   Collection Time: 01/25/18  4:11 PM  Result Value Ref Range Status   Yeast Wet Prep HPF POC NONE SEEN NONE SEEN Final   Trich, Wet Prep NONE SEEN NONE SEEN Final   Clue Cells Wet Prep HPF POC NONE SEEN NONE SEEN Final   WBC, Wet Prep HPF POC MANY (A) NONE SEEN Final   Sperm NONE SEEN  Final    Comment: Performed at [facility], [address], [place], [place] [address]     Radiological Exams on Admission: US Transvaginal Non-ob  Result Date: 01/25/2018 CLINICAL DATA:  Mid abdominal pain EXAM: TRANSABDOMINAL AND TRANSVAGINAL ULTRASOUND OF PELVIS DOPPLER ULTRASOUND OF OVARIES TECHNIQUE: Both transabdominal and transvaginal ultrasound examinations of the pelvis were performed. Transabdominal technique was performed for global imaging of the pelvis  including uterus, ovaries, adnexal regions, and pelvic cul-de-sac. It was necessary to proceed with endovaginal exam following the transabdominal exam to visualize the endometrium and ovaries. Color and duplex Doppler ultrasound was utilized to evaluate blood flow to the ovaries. COMPARISON:  None. FINDINGS: Uterus Measurements: 8.4 x 3.6 x 4.7 cm. No fibroids or other mass visualized. Endometrium Thickness: 5.8 mm.  No focal abnormality visualized. Right ovary Measurements: 2.8 x 2.3 x 2.1 cm. Normal appearance/no adnexal mass. Left ovary Measurements: 2.8 x 1.9 x 2.1 cm. Normal appearance/no adnexal mass. Pulsed Doppler evaluation of both ovaries demonstrates normal low-resistance arterial and venous waveforms. Other findings Moderate amount of pelvic free fluid. IMPRESSION: 1. No ovarian torsion. 2. Moderate amount of pelvic free fluid. Electronically Signed   By: Elige Ko   On: 01/25/2018 16:59   US Pelvis  Complete  Result Date: 01/25/2018 CLINICAL DATA:  Mid abdominal pain EXAM: TRANSABDOMINAL AND TRANSVAGINAL ULTRASOUND OF PELVIS DOPPLER ULTRASOUND OF OVARIES TECHNIQUE: Both transabdominal and transvaginal ultrasound examinations of the pelvis were performed. Transabdominal technique was performed for global imaging of the pelvis including uterus, ovaries, adnexal regions, and pelvic cul-de-sac. It was necessary to proceed with endovaginal exam following the transabdominal exam to visualize the endometrium and ovaries. Color and duplex Doppler ultrasound was utilized to evaluate blood flow to the ovaries. COMPARISON:  None. FINDINGS: Uterus Measurements: 8.4 x 3.6 x 4.7 cm. No fibroids or other mass visualized. Endometrium Thickness: 5.8 mm.  No focal abnormality visualized. Right ovary Measurements: 2.8 x 2.3 x 2.1 cm. Normal appearance/no adnexal mass. Left ovary Measurements: 2.8 x 1.9 x 2.1 cm. Normal appearance/no adnexal mass. Pulsed Doppler evaluation of both ovaries demonstrates normal low-resistance arterial and venous waveforms. Other findings Moderate amount of pelvic free fluid. IMPRESSION: 1. No ovarian torsion. 2. Moderate amount of pelvic free fluid. Electronically Signed   By: Elige Ko   On: 01/25/2018 16:59   Ct Abdomen Pelvis W Contrast  Result Date: 01/25/2018 CLINICAL DATA:  Abdominal pain. Intermittent abdominal pain which has become constant. EXAM: CT ABDOMEN AND PELVIS WITH CONTRAST TECHNIQUE: Multidetector CT imaging of the abdomen and pelvis was performed using the standard protocol following bolus administration of intravenous contrast. CONTRAST:  OMNIPAQUE IOHEXOL 300 MG/ML  SOLN COMPARISON:  None. FINDINGS: Lower chest: No acute abnormality. Hepatobiliary: No focal liver abnormality is seen. Status post cholecystectomy. No biliary dilatation. Pancreas: Unremarkable. No pancreatic ductal dilatation or surrounding inflammatory changes. Spleen: Normal in size without focal  abnormality. Adrenals/Urinary Tract: Adrenal glands are unremarkable. Kidneys are normal, without renal calculi, focal lesion, or hydronephrosis. Bladder is unremarkable. Stomach/Bowel: Stomach is within normal limits. Appendix appears normal. No bowel dilatation to suggest obstruction. Bowel wall thickening and air-fluid levels involving the distal ileum most concerning for enteritis secondary to an infectious or inflammatory etiology. Small volume pelvic free fluid. Vascular/Lymphatic: No significant vascular findings are present. No enlarged abdominal or pelvic lymph nodes. Reproductive: Uterus and bilateral adnexa are unremarkable. Other: No abdominal wall hernia or abnormality. Musculoskeletal: No acute osseous abnormality. No aggressive osseous lesion. Posterior lumbar fusion at L5-S1. IMPRESSION: 1. Bowel wall thickening and air-fluid levels involving the distal ileum most concerning for enteritis secondary to an infectious or inflammatory etiology. Small amount of pelvic free fluid. Electronically Signed   By: Elige Ko   On: 01/25/2018 13:24   [place] Art/ven Flow Abd Pelv Doppler  Result Date: 01/25/2018 CLINICAL DATA:  Mid abdominal pain EXAM: TRANSABDOMINAL AND TRANSVAGINAL ULTRASOUND OF PELVIS DOPPLER ULTRASOUND OF OVARIES  TECHNIQUE: Both transabdominal and transvaginal ultrasound examinations of the pelvis were performed. Transabdominal technique was performed for global imaging of the pelvis including uterus, ovaries, adnexal regions, and pelvic cul-de-sac. It was necessary to proceed with endovaginal exam following the transabdominal exam to visualize the endometrium and ovaries. Color and duplex Doppler ultrasound was utilized to evaluate blood flow to the ovaries. COMPARISON:  None. FINDINGS: Uterus Measurements: 8.4 x 3.6 x 4.7 cm. No fibroids or other mass visualized. Endometrium Thickness: 5.8 mm.  No focal abnormality visualized. Right ovary Measurements: 2.8 x 2.3 x 2.1 cm. Normal  appearance/no adnexal mass. Left ovary Measurements: 2.8 x 1.9 x 2.1 cm. Normal appearance/no adnexal mass. Pulsed Doppler evaluation of both ovaries demonstrates normal low-resistance arterial and venous waveforms. Other findings Moderate amount of pelvic free fluid. IMPRESSION: 1. No ovarian torsion. 2. Moderate amount of pelvic free fluid. Electronically Signed   By: Elige Ko   On: 01/25/2018 16:59      Assessment/Plan Principal Problem:   Distal Enteritis Active Problems:   Leucocytosis    #1 distal enteritis: Patient had infectious enteritis but could also be early inflammatory bowel disease. Would empirically start Cipro and Flagyl. Pain management. Nausea vomiting control. GI will be consulted for their opinion in the morning.  #2 leukocytosis: Probably secondary to the enteritis. Continue treatment.  #3 anxiety states: Patient very anxious. Counseling provided.   DVT prophylaxis: Lovenox  Code Status: Full  Family Communication: Husband Disposition Plan: Home  Consults called: GI called by ER-Dr. Georgiann Cocker   Admission status: Observation  Severity of Illness: The appropriate patient status for this patient is OBSERVATION. Observation status is judged to be reasonable and necessary in order to provide the required intensity of service to ensure the patient's safety. The patient's presenting symptoms, physical exam findings, and initial radiographic and laboratory data in the context of their medical condition is felt to place them at decreased risk for further clinical deterioration. Furthermore, it is anticipated that the patient will be medically stable for discharge from the hospital within 2 midnights of admission. The following factors support the patient status of observation.   " The patient's presenting symptoms include abdominal pain with nausea. " The physical exam findings include abdominal tenderness. " The initial radiographic and laboratory data are distal  enteritis on CT scan.     Lonia Blood MD [facility] Pager [phone]  If 7PM-7AM, please contact night-coverage [email or web address] Password [email or web address]  01/25/2018, 9:10 PM

## 2018-01-25 NOTE — ED Triage Notes (Signed)
Reports waking up @ 0530 this AM with intermittent mid-abd pains, which have since become constant.  Has had 3 episodes diarrhea and some nausea.  Denies fevers.  Difficulty ambulating due to pain.

## 2018-01-25 NOTE — ED Notes (Signed)
Pt provided with water, ginger ale, and saltine crackers

## 2018-01-25 NOTE — ED Triage Notes (Signed)
Pt presents for evaluation of intermittent abd pain. 3 episodes of diarrhea, nausea. No vomiting. Denies fever. Was seen at [facility] and sent here.

## 2018-01-26 DIAGNOSIS — K529 Noninfective gastroenteritis and colitis, unspecified: Secondary | ICD-10-CM | POA: Diagnosis not present

## 2018-01-26 DIAGNOSIS — D72829 Elevated white blood cell count, unspecified: Secondary | ICD-10-CM | POA: Diagnosis not present

## 2018-01-26 LAB — URINE CULTURE

## 2018-01-26 LAB — COMPREHENSIVE METABOLIC PANEL
ALBUMIN: 3.1 g/dL — AB (ref 3.5–5.0)
ALK PHOS: 76 U/L (ref 38–126)
ALT: 15 U/L (ref 14–54)
AST: 16 U/L (ref 15–41)
Anion gap: 6 (ref 5–15)
BILIRUBIN TOTAL: 1.1 mg/dL (ref 0.3–1.2)
BUN: 7 mg/dL (ref 6–20)
CALCIUM: 8.3 mg/dL — AB (ref 8.9–10.3)
CO2: 26 mmol/L (ref 22–32)
Chloride: 108 mmol/L (ref 101–111)
Creatinine, Ser: 0.92 mg/dL (ref 0.44–1.00)
GFR calc Af Amer: >60 mL/min (ref >60)
GLUCOSE: 106 mg/dL — AB (ref 65–99)
Potassium: 3.4 mmol/L — ABNORMAL LOW (ref 3.5–5.1)
Sodium: 140 mmol/L (ref 135–145)
TOTAL PROTEIN: 5.9 g/dL — AB (ref 6.5–8.1)

## 2018-01-26 LAB — GC/CHLAMYDIA PROBE AMP (~~LOC~~) NOT AT ARMC
CHLAMYDIA, DNA PROBE: NEGATIVE
NEISSERIA GONORRHEA: NEGATIVE

## 2018-01-26 LAB — CBC
HCT: 34.3 % — ABNORMAL LOW (ref 36.0–46.0)
Hemoglobin: 11.8 g/dL — ABNORMAL LOW (ref 12.0–15.0)
MCH: 30.3 pg (ref 26.0–34.0)
MCHC: 34.4 g/dL (ref 30.0–36.0)
MCV: 87.9 fL (ref 78.0–100.0)
Platelets: 204 10*3/uL (ref 150–400)
RBC: 3.9 MIL/uL (ref 3.87–5.11)
RDW: 11.8 % (ref 11.5–15.5)
WBC: 4.8 10*3/uL (ref 4.0–10.5)

## 2018-01-26 LAB — HIV ANTIBODY (ROUTINE TESTING W REFLEX): HIV Screen 4th Generation wRfx: NONREACTIVE

## 2018-01-26 MED ORDER — PROMETHAZINE HCL 25 MG/ML IJ SOLN
12.5000 mg | Freq: Once | INTRAMUSCULAR | Status: AC
  Administered 2018-01-26: 12.5 mg via INTRAVENOUS
  Filled 2018-01-26: qty 1

## 2018-01-26 MED ORDER — HYDROCODONE-ACETAMINOPHEN 5-325 MG PO TABS
1.0000 | ORAL_TABLET | Freq: Four times a day (QID) | ORAL | Status: DC | PRN
Start: 2018-01-26 — End: 2018-01-28
  Administered 2018-01-26: 1 via ORAL
  Filled 2018-01-26 (×2): qty 1

## 2018-01-26 MED ORDER — MORPHINE SULFATE (PF) 2 MG/ML IV SOLN
1.0000 mg | INTRAVENOUS | Status: DC | PRN
  Administered 2018-01-26 (×2): 1 mg via INTRAVENOUS
  Filled 2018-01-26 (×2): qty 1

## 2018-01-26 NOTE — Progress Notes (Signed)
Progress Note    Maureen Nelson  RUE:[id number] DOB: 01/19/1986  DOA: 01/25/2018 PCP: Milus Height, PA-C    Brief Narrative:    Medical records reviewed and are as summarized below:  Maureen Nelson is an 32 y.o. female with medical history significant of acute cholecystitis status post cholecystectomy, no other significant history presented to the ER was abdominal pain nausea and vomiting. Pain is rated as 9 out of 10. She was evaluated and found to have distal ilitis.        Assessment/Plan:   Principal Problem:   Distal Enteritis Active Problems:   Leucocytosis  Abdominal pain due to distal ileum enteritis:  - infectious enteritis but could also be early inflammatory bowel disease. -Cipro and Flagyl -Pain management -ER spoke with GI: Discussed case with Dr. Georgiann Cocker from gastroenterology, who stated that there are no further studies that need to be completed with regards to the GI standpoint however patient may need to be admitted if her pain is not under control.  Leukocytosis: -trending down  Hypokalemia -replete  obesity Body mass index is 32.22 kg/m.   Family Communication/Anticipated D/C date and plan/Code Status   DVT prophylaxis: Lovenox ordered. Code Status: Full Code.  Family Communication: none Disposition Plan: home once pain improved and eating   Medical Consultants:    GI (phone)   Subjective:   C/o dry heaves last PM  Objective:    Vitals:   01/25/18 2154 01/26/18 0527 01/26/18 0800 01/26/18 0836  BP: 112/71 90/60 (!) 94/49 (!) 94/49  Pulse: 77 62    Resp: 18 17    Temp: 98.1 F (36.7 C) 97.9 F (36.6 C)    TempSrc: Oral Oral    SpO2: 98% 97%    Weight: 93.3 kg (205 lb 11 oz)     Height:  (1.702 m)      No intake or output data in the 24 hours ending 01/26/18 0932 Filed Weights   01/25/18 2154  Weight: 93.3 kg (205 lb 11 oz)    Exam:  in bed NAD +BS, tender to palpation in RLQ No LE Edema No  rash   Data Reviewed:   I have personally reviewed following labs and imaging studies:  Labs: Labs show the following:   Basic Metabolic Panel: Recent Labs  Lab 01/25/18 1103 01/26/18 0542  NA 139 140  K 4.2 3.4*  CL 107 108  CO2 24 26  GLUCOSE 92 106*  BUN 11 7  CREATININE 1.02* 0.92  CALCIUM 9.2 8.3*   GFR Estimated Creatinine Clearance: 103 mL/min (by C-G formula based on SCr of 0.92 mg/dL). Liver Function Tests: Recent Labs  Lab 01/25/18 1103 01/26/18 0542  AST 19 16  ALT 22 15  ALKPHOS 98 76  BILITOT 1.1 1.1  PROT 7.4 5.9*  ALBUMIN 4.2 3.1*   Recent Labs  Lab 01/25/18 1103  LIPASE 30   No results for input(s): AMMONIA in the last 168 hours. Coagulation profile No results for input(s): INR, PROTIME in the last 168 hours.  CBC: Recent Labs  Lab 01/25/18 1103 01/26/18 0542  WBC 12.4* 4.8  HGB 14.9 11.8*  HCT 43.2 34.3*  MCV 87.6 87.9  PLT 270 204   Cardiac Enzymes: No results for input(s): CKTOTAL, CKMB, CKMBINDEX, TROPONINI in the last 168 hours. BNP (last 3 results) No results for input(s): PROBNP in the last 8760 hours. CBG: No results for input(s): GLUCAP in the last 168 hours. D-Dimer: No results for  input(s): DDIMER in the last 72 hours. Hgb A1c: No results for input(s): HGBA1C in the last 72 hours. Lipid Profile: No results for input(s): CHOL, HDL, LDLCALC, TRIG, CHOLHDL, LDLDIRECT in the last 72 hours. Thyroid function studies: No results for input(s): TSH, T4TOTAL, T3FREE, THYROIDAB in the last 72 hours.  Invalid input(s): FREET3 Anemia work up: No results for input(s): VITAMINB12, FOLATE, FERRITIN, TIBC, IRON, RETICCTPCT in the last 72 hours. Sepsis Labs: Recent Labs  Lab 01/25/18 1103 01/25/18 1136 01/25/18 1834 01/26/18 0542  WBC 12.4*  --   --  4.8  LATICACIDVEN  --  1.10 0.72  --     Microbiology Recent Results (from the past 240 hour(s))  Wet prep, genital     Status: Abnormal   Collection Time: 01/25/18  4:11  PM  Result Value Ref Range Status   Yeast Wet Prep HPF POC NONE SEEN NONE SEEN Final   Trich, Wet Prep NONE SEEN NONE SEEN Final   Clue Cells Wet Prep HPF POC NONE SEEN NONE SEEN Final   WBC, Wet Prep HPF POC MANY (A) NONE SEEN Final   Sperm NONE SEEN  Final    Comment: Performed at [facility], [address], [place], [place] [address]    Procedures and diagnostic studies:  US Transvaginal Non-ob  Result Date: 01/25/2018 CLINICAL DATA:  Mid abdominal pain EXAM: TRANSABDOMINAL AND TRANSVAGINAL ULTRASOUND OF PELVIS DOPPLER ULTRASOUND OF OVARIES TECHNIQUE: Both transabdominal and transvaginal ultrasound examinations of the pelvis were performed. Transabdominal technique was performed for global imaging of the pelvis including uterus, ovaries, adnexal regions, and pelvic cul-de-sac. It was necessary to proceed with endovaginal exam following the transabdominal exam to visualize the endometrium and ovaries. Color and duplex Doppler ultrasound was utilized to evaluate blood flow to the ovaries. COMPARISON:  None. FINDINGS: Uterus Measurements: 8.4 x 3.6 x 4.7 cm. No fibroids or other mass visualized. Endometrium Thickness: 5.8 mm.  No focal abnormality visualized. Right ovary Measurements: 2.8 x 2.3 x 2.1 cm. Normal appearance/no adnexal mass. Left ovary Measurements: 2.8 x 1.9 x 2.1 cm. Normal appearance/no adnexal mass. Pulsed Doppler evaluation of both ovaries demonstrates normal low-resistance arterial and venous waveforms. Other findings Moderate amount of pelvic free fluid. IMPRESSION: 1. No ovarian torsion. 2. Moderate amount of pelvic free fluid. Electronically Signed   By: Elige Ko   On: 01/25/2018 16:59   US Pelvis Complete  Result Date: 01/25/2018 CLINICAL DATA:  Mid abdominal pain EXAM: TRANSABDOMINAL AND TRANSVAGINAL ULTRASOUND OF PELVIS DOPPLER ULTRASOUND OF OVARIES TECHNIQUE: Both transabdominal and transvaginal ultrasound examinations of the pelvis were performed.  Transabdominal technique was performed for global imaging of the pelvis including uterus, ovaries, adnexal regions, and pelvic cul-de-sac. It was necessary to proceed with endovaginal exam following the transabdominal exam to visualize the endometrium and ovaries. Color and duplex Doppler ultrasound was utilized to evaluate blood flow to the ovaries. COMPARISON:  None. FINDINGS: Uterus Measurements: 8.4 x 3.6 x 4.7 cm. No fibroids or other mass visualized. Endometrium Thickness: 5.8 mm.  No focal abnormality visualized. Right ovary Measurements: 2.8 x 2.3 x 2.1 cm. Normal appearance/no adnexal mass. Left ovary Measurements: 2.8 x 1.9 x 2.1 cm. Normal appearance/no adnexal mass. Pulsed Doppler evaluation of both ovaries demonstrates normal low-resistance arterial and venous waveforms. Other findings Moderate amount of pelvic free fluid. IMPRESSION: 1. No ovarian torsion. 2. Moderate amount of pelvic free fluid. Electronically Signed   By: Elige Ko   On: 01/25/2018 16:59   Ct Abdomen  Pelvis W Contrast  Result Date: 01/25/2018 CLINICAL DATA:  Abdominal pain. Intermittent abdominal pain which has become constant. EXAM: CT ABDOMEN AND PELVIS WITH CONTRAST TECHNIQUE: Multidetector CT imaging of the abdomen and pelvis was performed using the standard protocol following bolus administration of intravenous contrast. CONTRAST:  OMNIPAQUE IOHEXOL 300 MG/ML  SOLN COMPARISON:  None. FINDINGS: Lower chest: No acute abnormality. Hepatobiliary: No focal liver abnormality is seen. Status post cholecystectomy. No biliary dilatation. Pancreas: Unremarkable. No pancreatic ductal dilatation or surrounding inflammatory changes. Spleen: Normal in size without focal abnormality. Adrenals/Urinary Tract: Adrenal glands are unremarkable. Kidneys are normal, without renal calculi, focal lesion, or hydronephrosis. Bladder is unremarkable. Stomach/Bowel: Stomach is within normal limits. Appendix appears normal. No bowel dilatation  to suggest obstruction. Bowel wall thickening and air-fluid levels involving the distal ileum most concerning for enteritis secondary to an infectious or inflammatory etiology. Small volume pelvic free fluid. Vascular/Lymphatic: No significant vascular findings are present. No enlarged abdominal or pelvic lymph nodes. Reproductive: Uterus and bilateral adnexa are unremarkable. Other: No abdominal wall hernia or abnormality. Musculoskeletal: No acute osseous abnormality. No aggressive osseous lesion. Posterior lumbar fusion at L5-S1. IMPRESSION: 1. Bowel wall thickening and air-fluid levels involving the distal ileum most concerning for enteritis secondary to an infectious or inflammatory etiology. Small amount of pelvic free fluid. Electronically Signed   By: Elige Ko   On: 01/25/2018 13:24   [place] Art/ven Flow Abd Pelv Doppler  Result Date: 01/25/2018 CLINICAL DATA:  Mid abdominal pain EXAM: TRANSABDOMINAL AND TRANSVAGINAL ULTRASOUND OF PELVIS DOPPLER ULTRASOUND OF OVARIES TECHNIQUE: Both transabdominal and transvaginal ultrasound examinations of the pelvis were performed. Transabdominal technique was performed for global imaging of the pelvis including uterus, ovaries, adnexal regions, and pelvic cul-de-sac. It was necessary to proceed with endovaginal exam following the transabdominal exam to visualize the endometrium and ovaries. Color and duplex Doppler ultrasound was utilized to evaluate blood flow to the ovaries. COMPARISON:  None. FINDINGS: Uterus Measurements: 8.4 x 3.6 x 4.7 cm. No fibroids or other mass visualized. Endometrium Thickness: 5.8 mm.  No focal abnormality visualized. Right ovary Measurements: 2.8 x 2.3 x 2.1 cm. Normal appearance/no adnexal mass. Left ovary Measurements: 2.8 x 1.9 x 2.1 cm. Normal appearance/no adnexal mass. Pulsed Doppler evaluation of both ovaries demonstrates normal low-resistance arterial and venous waveforms. Other findings Moderate amount of pelvic free fluid.  IMPRESSION: 1. No ovarian torsion. 2. Moderate amount of pelvic free fluid. Electronically Signed   By: Elige Ko   On: 01/25/2018 16:59    Medications:   . enoxaparin (LOVENOX) injection  40 mg Subcutaneous Q24H  . norethindrone  1 tablet Oral QHS   Continuous Infusions: . ciprofloxacin 400 mg (01/26/18 0836)  . dextrose 5 % and 0.45% NaCl 125 mL/hr at 01/26/18 0835  . metronidazole Stopped (01/26/18 0645)     LOS: 0 days   Joseph Art  [facility]   *Please refer to [email or web address], password [email or web address] to get updated schedule on who will round on this patient, as hospitalists switch teams weekly. If 7PM-7AM, please contact night-coverage at [email or web address], password [email or web address] for any overnight needs.  01/26/2018, 9:32 AM

## 2018-01-27 DIAGNOSIS — K529 Noninfective gastroenteritis and colitis, unspecified: Secondary | ICD-10-CM | POA: Diagnosis not present

## 2018-01-27 DIAGNOSIS — R000 Tachycardia, unspecified: Secondary | ICD-10-CM | POA: Diagnosis not present

## 2018-01-27 DIAGNOSIS — E669 Obesity, unspecified: Secondary | ICD-10-CM | POA: Diagnosis present

## 2018-01-27 DIAGNOSIS — R197 Diarrhea, unspecified: Secondary | ICD-10-CM | POA: Diagnosis not present

## 2018-01-27 DIAGNOSIS — Z9049 Acquired absence of other specified parts of digestive tract: Secondary | ICD-10-CM | POA: Diagnosis not present

## 2018-01-27 DIAGNOSIS — Z881 Allergy status to other antibiotic agents status: Secondary | ICD-10-CM | POA: Diagnosis not present

## 2018-01-27 DIAGNOSIS — R112 Nausea with vomiting, unspecified: Secondary | ICD-10-CM | POA: Diagnosis not present

## 2018-01-27 DIAGNOSIS — D72829 Elevated white blood cell count, unspecified: Secondary | ICD-10-CM | POA: Diagnosis not present

## 2018-01-27 DIAGNOSIS — R933 Abnormal findings on diagnostic imaging of other parts of digestive tract: Secondary | ICD-10-CM | POA: Diagnosis not present

## 2018-01-27 DIAGNOSIS — Z8719 Personal history of other diseases of the digestive system: Secondary | ICD-10-CM | POA: Diagnosis not present

## 2018-01-27 DIAGNOSIS — E876 Hypokalemia: Secondary | ICD-10-CM | POA: Diagnosis not present

## 2018-01-27 DIAGNOSIS — R1031 Right lower quadrant pain: Secondary | ICD-10-CM | POA: Diagnosis present

## 2018-01-27 DIAGNOSIS — Z8249 Family history of ischemic heart disease and other diseases of the circulatory system: Secondary | ICD-10-CM | POA: Diagnosis not present

## 2018-01-27 DIAGNOSIS — Z981 Arthrodesis status: Secondary | ICD-10-CM | POA: Diagnosis not present

## 2018-01-27 DIAGNOSIS — A09 Infectious gastroenteritis and colitis, unspecified: Secondary | ICD-10-CM | POA: Diagnosis not present

## 2018-01-27 DIAGNOSIS — F411 Generalized anxiety disorder: Secondary | ICD-10-CM | POA: Diagnosis present

## 2018-01-27 DIAGNOSIS — Z6832 Body mass index (BMI) 32.0-32.9, adult: Secondary | ICD-10-CM | POA: Diagnosis not present

## 2018-01-27 LAB — BASIC METABOLIC PANEL
Anion gap: 8 (ref 5–15)
CHLORIDE: 107 mmol/L (ref 101–111)
CO2: 27 mmol/L (ref 22–32)
Calcium: 8.7 mg/dL — ABNORMAL LOW (ref 8.9–10.3)
Creatinine, Ser: 1.02 mg/dL — ABNORMAL HIGH (ref 0.44–1.00)
GFR calc non Af Amer: >60 mL/min (ref >60)
GLUCOSE: 79 mg/dL (ref 65–99)
Potassium: 3.5 mmol/L (ref 3.5–5.1)
Sodium: 142 mmol/L (ref 135–145)

## 2018-01-27 LAB — CBC
HEMATOCRIT: 35 % — AB (ref 36.0–46.0)
HEMOGLOBIN: 12 g/dL (ref 12.0–15.0)
MCH: 30.5 pg (ref 26.0–34.0)
MCHC: 34.3 g/dL (ref 30.0–36.0)
MCV: 88.8 fL (ref 78.0–100.0)
Platelets: 203 10*3/uL (ref 150–400)
RBC: 3.94 MIL/uL (ref 3.87–5.11)
RDW: 11.8 % (ref 11.5–15.5)
WBC: 4.2 10*3/uL (ref 4.0–10.5)

## 2018-01-27 MED ORDER — PROMETHAZINE HCL 25 MG/ML IJ SOLN
6.2500 mg | Freq: Three times a day (TID) | INTRAMUSCULAR | Status: DC | PRN
  Administered 2018-01-27: 6.25 mg via INTRAVENOUS
  Filled 2018-01-27: qty 1

## 2018-01-27 NOTE — Consult Note (Signed)
[facility] Gastroenterology Consultation Note  Referring Provider: Dr. Marlin Canary [facility]) Primary Care Physician:  Milus Height, PA-C  Reason for Consultation:  Abdominal pain  HPI: Maureen Nelson is a 32 y.o. female admitted with two-day history abdominal pain, nausea, vomiting, diarrhea.  Pain improving, is periumbilical and right lower quadrant, stabbing, constant.  Diarrhea resolved. No fevers.  No blood in stool.  Having ongoing nausea and vomiting, unable to tolerate much liquids yesterday.  No significant prior abdominal issues other than similar type illness while in college.  No prior endoscopy or colonoscopy.  Mother with irritable bowel syndrome, no other GI problems in family.  No recent travel, antibiotics, or clear sick exposures (but works as a Runner, broadcasting/film/video).   Past Medical History:  Diagnosis Date  . Complication of anesthesia    Difficulty "waking up"  . Degenerative disc disease, lumbar   . Degenerative disc disease, lumbar   . Headache     Past Surgical History:  Procedure Laterality Date  . BACK SURGERY    . BREAST LUMPECTOMY    . BREAST LUMPECTOMY    . CHOLECYSTECTOMY    . SPINAL FUSION      Prior to Admission medications   Medication Sig Start Date End Date Taking? Authorizing Provider  acetaminophen (TYLENOL) 500 MG tablet Take 1,000 mg by mouth every 6 (six) hours as needed for mild pain or headache.   Yes [provider]  albuterol (PROVENTIL HFA;VENTOLIN HFA) 108 (90 Base) MCG/ACT inhaler Inhale 1-2 puffs into the lungs every 6 (six) hours as needed for wheezing or shortness of breath.   Yes [provider]  norethindrone (MICRONOR,CAMILA,ERRIN) 0.35 MG tablet Take 1 tablet by mouth at bedtime.  01/23/18  Yes [provider]    Current Facility-Administered Medications  Medication Dose Route Frequency Provider Last Rate Last Dose  . ciprofloxacin (CIPRO) IVPB 400 mg  400 mg Intravenous Q12H Rometta Emery, MD   Stopped at 01/26/18  2230  . dextrose 5 %-0.45 % sodium chloride infusion   Intravenous Continuous Rometta Emery, MD 125 mL/hr at 01/27/18 0455    . enoxaparin (LOVENOX) injection 40 mg  40 mg Subcutaneous Q24H Earlie Lou L, MD   40 mg at 01/26/18 2039  . HYDROcodone-acetaminophen (NORCO/VICODIN) 5-325 MG per tablet 1 tablet  1 tablet Oral Q6H PRN Marlin Canary U, DO   1 tablet at 01/26/18 0847  . metroNIDAZOLE (FLAGYL) IVPB 500 mg  500 mg Intravenous Q8H Rometta Emery, MD   Stopped at 01/27/18 0555  . morphine 2 MG/ML injection 1 mg  1 mg Intravenous Q4H PRN Marlin Canary U, DO   1 mg at 01/26/18 2039  . ondansetron (ZOFRAN) tablet 4 mg  4 mg Oral Q6H PRN Rometta Emery, MD       Or  . ondansetron (ZOFRAN) injection 4 mg  4 mg Intravenous Q6H PRN Rometta Emery, MD   4 mg at 01/26/18 1748    Allergies as of 01/25/2018 - Review Complete 01/25/2018  Allergen Reaction Noted  . Dicyclomine hcl Rash 11/30/2011    Family History  Problem Relation Age of Onset  . Heart disease Maternal Grandmother   . Cancer Maternal Grandfather   . Cancer Paternal Grandfather     Social History   Socioeconomic History  . Marital status: Married    Spouse name: Not on file  . Number of children: Not on file  . Years of education: Not on file  . Highest education  level: Not on file  Occupational History  . Not on file  Social Needs  . Financial resource strain: Not on file  . Food insecurity:    Worry: Not on file    Inability: Not on file  . Transportation needs:    Medical: Not on file    Non-medical: Not on file  Tobacco Use  . Smoking status: Never Smoker  . Smokeless tobacco: Never Used  Substance and Sexual Activity  . Alcohol use: No    Comment: Occasional  . Drug use: No  . Sexual activity: Yes    Birth control/protection: Pill  Lifestyle  . Physical activity:    Days per week: Not on file    Minutes per session: Not on file  . Stress: Not on file  Relationships  . Social  connections:    Talks on phone: Not on file    Gets together: Not on file    Attends religious service: Not on file    Active member of club or organization: Not on file    Attends meetings of clubs or organizations: Not on file    Relationship status: Not on file  . Intimate partner violence:    Fear of current or ex partner: Not on file    Emotionally abused: Not on file    Physically abused: Not on file    Forced sexual activity: Not on file  Other Topics Concern  . Not on file  Social History Narrative  . Not on file    Review of Systems: as per HPI, all others negative  Physical Exam: Vital signs in last 24 hours: Temp:  [98.2 F (36.8 C)-98.5 F (36.9 C)] 98.4 F (36.9 C) (05/29 0523) Pulse Rate:  [59-77] 59 (05/29 0523) Resp:  [16-20] 17 (05/29 0523) BP: (100-114)/(58-78) 106/78 (05/29 0523) SpO2:  [92 %-100 %] 98 % (05/29 0523) Last BM Date: 01/25/18 General:   Alert,  Well-developed, well-nourished, pleasant and cooperative in NAD Head:  Normocephalic and atraumatic. Eyes:  Sclera clear, no icterus.   Conjunctiva pink. Ears:  Normal auditory acuity. Nose:  No deformity, discharge,  or lesions. Mouth:  No deformity or lesions.  Oropharynx pink & moist. Neck:  Supple; no masses or thyromegaly. Lungs:  Clear throughout to auscultation.   No wheezes, crackles, or rhonchi. No acute distress. Heart:  Regular rate and rhythm; no murmurs, clicks, rubs,  or gallops. Abdomen:  Soft, protuberant, hypoactive bowel sounds, mild periumbilical tenderness. No masses, hepatosplenomegaly or hernias noted. Without guarding, and without rebound.     Msk:  Symmetrical without gross deformities. Normal posture. Pulses:  Normal pulses noted. Extremities:  Without clubbing or edema. Neurologic:  Alert and  oriented x4;  grossly normal neurologically. Skin:  Intact without significant lesions or rashes. Psych:  Alert and cooperative. Normal mood and affect.   Lab Results: Recent  Labs    01/25/18 1103 01/26/18 0542 01/27/18 0542  WBC 12.4* 4.8 4.2  HGB 14.9 11.8* 12.0  HCT 43.2 34.3* 35.0*  PLT 270 204 203   BMET Recent Labs    01/25/18 1103 01/26/18 0542 01/27/18 0542  NA 139 140 142  K 4.2 3.4* 3.5  CL 107 108 107  CO2 GLUCOSE 92 106* 79  BUN 11 7 <5*  CREATININE 1.02* 0.92 1.02*  CALCIUM 9.2 8.3* 8.7*   LFT Recent Labs    01/26/18 0542  PROT 5.9*  ALBUMIN 3.1*  AST 16  ALT 15  ALKPHOS  76  BILITOT 1.1   PT/INR No results for input(s): LABPROT, INR in the last 72 hours.  Studies/Results: US Transvaginal Non-ob  Result Date: 01/25/2018 CLINICAL DATA:  Mid abdominal pain EXAM: TRANSABDOMINAL AND TRANSVAGINAL ULTRASOUND OF PELVIS DOPPLER ULTRASOUND OF OVARIES TECHNIQUE: Both transabdominal and transvaginal ultrasound examinations of the pelvis were performed. Transabdominal technique was performed for global imaging of the pelvis including uterus, ovaries, adnexal regions, and pelvic cul-de-sac. It was necessary to proceed with endovaginal exam following the transabdominal exam to visualize the endometrium and ovaries. Color and duplex Doppler ultrasound was utilized to evaluate blood flow to the ovaries. COMPARISON:  None. FINDINGS: Uterus Measurements: 8.4 x 3.6 x 4.7 cm. No fibroids or other mass visualized. Endometrium Thickness: 5.8 mm.  No focal abnormality visualized. Right ovary Measurements: 2.8 x 2.3 x 2.1 cm. Normal appearance/no adnexal mass. Left ovary Measurements: 2.8 x 1.9 x 2.1 cm. Normal appearance/no adnexal mass. Pulsed Doppler evaluation of both ovaries demonstrates normal low-resistance arterial and venous waveforms. Other findings Moderate amount of pelvic free fluid. IMPRESSION: 1. No ovarian torsion. 2. Moderate amount of pelvic free fluid. Electronically Signed   By: Elige Ko   On: 01/25/2018 16:59   US Pelvis Complete  Result Date: 01/25/2018 CLINICAL DATA:  Mid abdominal pain EXAM: TRANSABDOMINAL AND  TRANSVAGINAL ULTRASOUND OF PELVIS DOPPLER ULTRASOUND OF OVARIES TECHNIQUE: Both transabdominal and transvaginal ultrasound examinations of the pelvis were performed. Transabdominal technique was performed for global imaging of the pelvis including uterus, ovaries, adnexal regions, and pelvic cul-de-sac. It was necessary to proceed with endovaginal exam following the transabdominal exam to visualize the endometrium and ovaries. Color and duplex Doppler ultrasound was utilized to evaluate blood flow to the ovaries. COMPARISON:  None. FINDINGS: Uterus Measurements: 8.4 x 3.6 x 4.7 cm. No fibroids or other mass visualized. Endometrium Thickness: 5.8 mm.  No focal abnormality visualized. Right ovary Measurements: 2.8 x 2.3 x 2.1 cm. Normal appearance/no adnexal mass. Left ovary Measurements: 2.8 x 1.9 x 2.1 cm. Normal appearance/no adnexal mass. Pulsed Doppler evaluation of both ovaries demonstrates normal low-resistance arterial and venous waveforms. Other findings Moderate amount of pelvic free fluid. IMPRESSION: 1. No ovarian torsion. 2. Moderate amount of pelvic free fluid. Electronically Signed   By: Elige Ko   On: 01/25/2018 16:59   Ct Abdomen Pelvis W Contrast  Result Date: 01/25/2018 CLINICAL DATA:  Abdominal pain. Intermittent abdominal pain which has become constant. EXAM: CT ABDOMEN AND PELVIS WITH CONTRAST TECHNIQUE: Multidetector CT imaging of the abdomen and pelvis was performed using the standard protocol following bolus administration of intravenous contrast. CONTRAST:  OMNIPAQUE IOHEXOL 300 MG/ML  SOLN COMPARISON:  None. FINDINGS: Lower chest: No acute abnormality. Hepatobiliary: No focal liver abnormality is seen. Status post cholecystectomy. No biliary dilatation. Pancreas: Unremarkable. No pancreatic ductal dilatation or surrounding inflammatory changes. Spleen: Normal in size without focal abnormality. Adrenals/Urinary Tract: Adrenal glands are unremarkable. Kidneys are normal, without  renal calculi, focal lesion, or hydronephrosis. Bladder is unremarkable. Stomach/Bowel: Stomach is within normal limits. Appendix appears normal. No bowel dilatation to suggest obstruction. Bowel wall thickening and air-fluid levels involving the distal ileum most concerning for enteritis secondary to an infectious or inflammatory etiology. Small volume pelvic free fluid. Vascular/Lymphatic: No significant vascular findings are present. No enlarged abdominal or pelvic lymph nodes. Reproductive: Uterus and bilateral adnexa are unremarkable. Other: No abdominal wall hernia or abnormality. Musculoskeletal: No acute osseous abnormality. No aggressive osseous lesion. Posterior lumbar fusion at L5-S1. IMPRESSION: 1. Bowel wall thickening  and air-fluid levels involving the distal ileum most concerning for enteritis secondary to an infectious or inflammatory etiology. Small amount of pelvic free fluid. Electronically Signed   By: Elige Ko   On: 01/25/2018 13:24   [place] Art/ven Flow Abd Pelv Doppler  Result Date: 01/25/2018 CLINICAL DATA:  Mid abdominal pain EXAM: TRANSABDOMINAL AND TRANSVAGINAL ULTRASOUND OF PELVIS DOPPLER ULTRASOUND OF OVARIES TECHNIQUE: Both transabdominal and transvaginal ultrasound examinations of the pelvis were performed. Transabdominal technique was performed for global imaging of the pelvis including uterus, ovaries, adnexal regions, and pelvic cul-de-sac. It was necessary to proceed with endovaginal exam following the transabdominal exam to visualize the endometrium and ovaries. Color and duplex Doppler ultrasound was utilized to evaluate blood flow to the ovaries. COMPARISON:  None. FINDINGS: Uterus Measurements: 8.4 x 3.6 x 4.7 cm. No fibroids or other mass visualized. Endometrium Thickness: 5.8 mm.  No focal abnormality visualized. Right ovary Measurements: 2.8 x 2.3 x 2.1 cm. Normal appearance/no adnexal mass. Left ovary Measurements: 2.8 x 1.9 x 2.1 cm. Normal appearance/no adnexal mass.  Pulsed Doppler evaluation of both ovaries demonstrates normal low-resistance arterial and venous waveforms. Other findings Moderate amount of pelvic free fluid. IMPRESSION: 1. No ovarian torsion. 2. Moderate amount of pelvic free fluid. Electronically Signed   By: Elige Ko   On: 01/25/2018 16:59    Impression:  -Acute onset abdominal pain, diarrhea, nausea, vomiting.  Suspect infectious process. -Nausea and vomiting. -Abdominal pain, improving. -Abnormal CT scan, enteritis in ileum.  Plan:  1.  Supportive care with IVF, antiemetics, judicious analgesics (aware this may worsen her nausea and vomiting). 2.  Clear liquids today, advance as tolerated. 3.  No plans for further testing/intervention, I suspect symptoms will slowly improve over the next few days and anticipate likely hospital discharge in the next day or two. 4.  [facility] will sign-off; please call with questions; thank you for the consultation.   LOS: 0 days   Margene Cherian M  01/27/2018, 8:42 AM  Cell [phone] If no answer or after 5 PM call [phone]

## 2018-01-27 NOTE — Progress Notes (Signed)
Progress Note    Maureen Nelson  NWG:[id number] DOB: Jan 23, 1986  DOA: 01/25/2018 PCP: Milus Height, PA-C    Brief Narrative:    Medical records reviewed and are as summarized below:  Maureen Nelson is an 32 y.o. female with medical history significant of acute cholecystitis status post cholecystectomy, no other significant history presented to the ER was abdominal pain nausea and vomiting. Pain is rated as 9 out of 10. She was evaluated and found to have distal ilitis.      Assessment/Plan:   Principal Problem:   Distal Enteritis Active Problems:   Leucocytosis  Abdominal pain due to distal ileum enteritis:  - infectious enteritis but could also be early inflammatory bowel disease (husband has been sick at home with N/V). -Cipro and Flagyl -Pain management -ambulate -GI consult appreciated:  1.  Supportive care with IVF, antiemetics, judicious analgesics (aware this may worsen her nausea and vomiting). 2.  Clear liquids today, advance as tolerated. 3.  No plans for further testing/intervention, I suspect symptoms will slowly improve over the next few days and anticipate likely hospital discharge in the next day or two.  Leukocytosis: -trending down  Hypokalemia -replete  obesity Body mass index is 32.22 kg/m.   Family Communication/Anticipated D/C date and plan/Code Status   DVT prophylaxis: Lovenox ordered. Code Status: Full Code.  Family Communication: none Disposition Plan: home once pain improved and eating   Medical Consultants:    GI    Subjective:   Slowly improving but still with dry heaves   Objective:    Vitals:   01/26/18 0836 01/26/18 1444 01/26/18 2241 01/27/18 0523  BP: (!) 94/49 (!) 100/58 114/70 106/78  Pulse:  77 69 (!) 59  Resp:  Temp:  98.2 F (36.8 C) 98.5 F (36.9 C) 98.4 F (36.9 C)  TempSrc:  Oral Oral Oral  SpO2:  100% 92% 98%  Weight:      Height:        Intake/Output Summary (Last 24 hours) at  01/27/2018 1339 Last data filed at 01/27/2018 1055 Gross per 24 hour  Intake 3152.92 ml  Output 50 ml  Net 3102.92 ml   Filed Weights   01/25/18 2154  Weight: 93.3 kg (205 lb 11 oz)    Exam: A+Ox3, NAD Tender to palpation No LE edema No increased work of breathing   Data Reviewed:   I have personally reviewed following labs and imaging studies:  Labs: Labs show the following:   Basic Metabolic Panel: Recent Labs  Lab 01/25/18 1103 01/26/18 0542 01/27/18 0542  NA 139 140 142  K 4.2 3.4* 3.5  CL 107 108 107  CO2 GLUCOSE 92 106* 79  BUN 11 7 <5*  CREATININE 1.02* 0.92 1.02*  CALCIUM 9.2 8.3* 8.7*   GFR Estimated Creatinine Clearance: 92.9 mL/min (A) (by C-G formula based on SCr of 1.02 mg/dL (H)). Liver Function Tests: Recent Labs  Lab 01/25/18 1103 01/26/18 0542  AST 19 16  ALT 22 15  ALKPHOS 98 76  BILITOT 1.1 1.1  PROT 7.4 5.9*  ALBUMIN 4.2 3.1*   Recent Labs  Lab 01/25/18 1103  LIPASE 30   No results for input(s): AMMONIA in the last 168 hours. Coagulation profile No results for input(s): INR, PROTIME in the last 168 hours.  CBC: Recent Labs  Lab 01/25/18 1103 01/26/18 0542 01/27/18 0542  WBC 12.4* 4.8 4.2  HGB 14.9 11.8* 12.0  HCT 43.2  34.3* 35.0*  MCV 87.6 87.9 88.8  PLT 270 204 203   Cardiac Enzymes: No results for input(s): CKTOTAL, CKMB, CKMBINDEX, TROPONINI in the last 168 hours. BNP (last 3 results) No results for input(s): PROBNP in the last 8760 hours. CBG: No results for input(s): GLUCAP in the last 168 hours. D-Dimer: No results for input(s): DDIMER in the last 72 hours. Hgb A1c: No results for input(s): HGBA1C in the last 72 hours. Lipid Profile: No results for input(s): CHOL, HDL, LDLCALC, TRIG, CHOLHDL, LDLDIRECT in the last 72 hours. Thyroid function studies: No results for input(s): TSH, T4TOTAL, T3FREE, THYROIDAB in the last 72 hours.  Invalid input(s): FREET3 Anemia work up: No results for  input(s): VITAMINB12, FOLATE, FERRITIN, TIBC, IRON, RETICCTPCT in the last 72 hours. Sepsis Labs: Recent Labs  Lab 01/25/18 1103 01/25/18 1136 01/25/18 1834 01/26/18 0542 01/27/18 0542  WBC 12.4*  --   --  4.8 4.2  LATICACIDVEN  --  1.10 0.72  --   --     Microbiology Recent Results (from the past 240 hour(s))  Wet prep, genital     Status: Abnormal   Collection Time: 01/25/18  4:11 PM  Result Value Ref Range Status   Yeast Wet Prep HPF POC NONE SEEN NONE SEEN Final   Trich, Wet Prep NONE SEEN NONE SEEN Final   Clue Cells Wet Prep HPF POC NONE SEEN NONE SEEN Final   WBC, Wet Prep HPF POC MANY (A) NONE SEEN Final   Sperm NONE SEEN  Final    Comment: Performed at [facility], [address] [place], [place] [address]  Urine culture     Status: Abnormal   Collection Time: 01/25/18  6:14 PM  Result Value Ref Range Status   Specimen Description URINE, RANDOM  Final   Special Requests   Final    NONE Performed at [facility], [address], [place], [place] [address]    Culture MULTIPLE SPECIES PRESENT, SUGGEST RECOLLECTION (A)  Final   Report Status 01/26/2018 FINAL  Final    Procedures and diagnostic studies:  US Transvaginal Non-ob  Result Date: 01/25/2018 CLINICAL DATA:  Mid abdominal pain EXAM: TRANSABDOMINAL AND TRANSVAGINAL ULTRASOUND OF PELVIS DOPPLER ULTRASOUND OF OVARIES TECHNIQUE: Both transabdominal and transvaginal ultrasound examinations of the pelvis were performed. Transabdominal technique was performed for global imaging of the pelvis including uterus, ovaries, adnexal regions, and pelvic cul-de-sac. It was necessary to proceed with endovaginal exam following the transabdominal exam to visualize the endometrium and ovaries. Color and duplex Doppler ultrasound was utilized to evaluate blood flow to the ovaries. COMPARISON:  None. FINDINGS: Uterus Measurements: 8.4 x 3.6 x 4.7 cm. No fibroids or other mass visualized. Endometrium Thickness: 5.8 mm.  No  focal abnormality visualized. Right ovary Measurements: 2.8 x 2.3 x 2.1 cm. Normal appearance/no adnexal mass. Left ovary Measurements: 2.8 x 1.9 x 2.1 cm. Normal appearance/no adnexal mass. Pulsed Doppler evaluation of both ovaries demonstrates normal low-resistance arterial and venous waveforms. Other findings Moderate amount of pelvic free fluid. IMPRESSION: 1. No ovarian torsion. 2. Moderate amount of pelvic free fluid. Electronically Signed   By: Elige Ko   On: 01/25/2018 16:59   US Pelvis Complete  Result Date: 01/25/2018 CLINICAL DATA:  Mid abdominal pain EXAM: TRANSABDOMINAL AND TRANSVAGINAL ULTRASOUND OF PELVIS DOPPLER ULTRASOUND OF OVARIES TECHNIQUE: Both transabdominal and transvaginal ultrasound examinations of the pelvis were performed. Transabdominal technique was performed for global imaging of the pelvis including uterus, ovaries, adnexal  regions, and pelvic cul-de-sac. It was necessary to proceed with endovaginal exam following the transabdominal exam to visualize the endometrium and ovaries. Color and duplex Doppler ultrasound was utilized to evaluate blood flow to the ovaries. COMPARISON:  None. FINDINGS: Uterus Measurements: 8.4 x 3.6 x 4.7 cm. No fibroids or other mass visualized. Endometrium Thickness: 5.8 mm.  No focal abnormality visualized. Right ovary Measurements: 2.8 x 2.3 x 2.1 cm. Normal appearance/no adnexal mass. Left ovary Measurements: 2.8 x 1.9 x 2.1 cm. Normal appearance/no adnexal mass. Pulsed Doppler evaluation of both ovaries demonstrates normal low-resistance arterial and venous waveforms. Other findings Moderate amount of pelvic free fluid. IMPRESSION: 1. No ovarian torsion. 2. Moderate amount of pelvic free fluid. Electronically Signed   By: Elige Ko   On: 01/25/2018 16:59   [place] Art/ven Flow Abd Pelv Doppler  Result Date: 01/25/2018 CLINICAL DATA:  Mid abdominal pain EXAM: TRANSABDOMINAL AND TRANSVAGINAL ULTRASOUND OF PELVIS DOPPLER ULTRASOUND OF OVARIES  TECHNIQUE: Both transabdominal and transvaginal ultrasound examinations of the pelvis were performed. Transabdominal technique was performed for global imaging of the pelvis including uterus, ovaries, adnexal regions, and pelvic cul-de-sac. It was necessary to proceed with endovaginal exam following the transabdominal exam to visualize the endometrium and ovaries. Color and duplex Doppler ultrasound was utilized to evaluate blood flow to the ovaries. COMPARISON:  None. FINDINGS: Uterus Measurements: 8.4 x 3.6 x 4.7 cm. No fibroids or other mass visualized. Endometrium Thickness: 5.8 mm.  No focal abnormality visualized. Right ovary Measurements: 2.8 x 2.3 x 2.1 cm. Normal appearance/no adnexal mass. Left ovary Measurements: 2.8 x 1.9 x 2.1 cm. Normal appearance/no adnexal mass. Pulsed Doppler evaluation of both ovaries demonstrates normal low-resistance arterial and venous waveforms. Other findings Moderate amount of pelvic free fluid. IMPRESSION: 1. No ovarian torsion. 2. Moderate amount of pelvic free fluid. Electronically Signed   By: Elige Ko   On: 01/25/2018 16:59    Medications:   . enoxaparin (LOVENOX) injection  40 mg Subcutaneous Q24H   Continuous Infusions: . ciprofloxacin Stopped (01/27/18 1055)  . dextrose 5 % and 0.45% NaCl 125 mL/hr at 01/27/18 0455  . metronidazole 500 mg (01/27/18 1252)     LOS: 0 days   Joseph Art  [facility]   *Please refer to [email or web address], password [email or web address] to get updated schedule on who will round on this patient, as hospitalists switch teams weekly. If 7PM-7AM, please contact night-coverage at [email or web address], password [email or web address] for any overnight needs.  01/27/2018, 1:39 PM

## 2018-01-28 LAB — CBC
HCT: 37.5 % (ref 36.0–46.0)
Hemoglobin: 13.1 g/dL (ref 12.0–15.0)
MCH: 30.4 pg (ref 26.0–34.0)
MCHC: 34.9 g/dL (ref 30.0–36.0)
MCV: 87 fL (ref 78.0–100.0)
PLATELETS: 228 10*3/uL (ref 150–400)
RBC: 4.31 MIL/uL (ref 3.87–5.11)
RDW: 11.6 % (ref 11.5–15.5)
WBC: 5.5 10*3/uL (ref 4.0–10.5)

## 2018-01-28 LAB — BASIC METABOLIC PANEL
ANION GAP: 10 (ref 5–15)
BUN: <5 mg/dL — ABNORMAL LOW (ref 6–20)
CALCIUM: 9.1 mg/dL (ref 8.9–10.3)
CO2: 25 mmol/L (ref 22–32)
Chloride: 106 mmol/L (ref 101–111)
Creatinine, Ser: 1.07 mg/dL — ABNORMAL HIGH (ref 0.44–1.00)
GFR calc Af Amer: >60 mL/min (ref >60)
GLUCOSE: 82 mg/dL (ref 65–99)
Potassium: 3.4 mmol/L — ABNORMAL LOW (ref 3.5–5.1)
SODIUM: 141 mmol/L (ref 135–145)

## 2018-01-28 MED ORDER — CIPROFLOXACIN HCL 500 MG PO TABS: 500.0000 mg | ORAL_TABLET | Freq: Two times a day (BID) | ORAL | 0 refills | Status: AC

## 2018-01-28 MED ORDER — METRONIDAZOLE 500 MG PO TABS: 500.0000 mg | ORAL_TABLET | Freq: Three times a day (TID) | ORAL | 0 refills | Status: AC

## 2018-01-28 MED ORDER — CIPROFLOXACIN HCL 500 MG PO TABS
500.0000 mg | ORAL_TABLET | Freq: Two times a day (BID) | ORAL | Status: DC
  Administered 2018-01-28: 500 mg via ORAL
  Filled 2018-01-28: qty 1

## 2018-01-28 MED ORDER — METRONIDAZOLE 500 MG PO TABS
500.0000 mg | ORAL_TABLET | Freq: Three times a day (TID) | ORAL | Status: DC
  Administered 2018-01-28 (×2): 500 mg via ORAL
  Filled 2018-01-28 (×2): qty 1

## 2018-01-28 NOTE — Plan of Care (Signed)
  Problem: Education: Goal: Knowledge of General Education information will improve Outcome: Progressing   Problem: Clinical Measurements: Goal: Ability to maintain clinical measurements within normal limits will improve Outcome: Progressing Goal: Respiratory complications will improve Outcome: Progressing   Problem: Activity: Goal: Risk for activity intolerance will decrease Outcome: Progressing   Problem: Nutrition: Goal: Adequate nutrition will be maintained Outcome: Progressing   Problem: Pain Managment: Goal: General experience of comfort will improve Outcome: Progressing

## 2018-01-28 NOTE — Discharge Summary (Signed)
Discharge Summary  Maureen Nelson:[id number] DOB: 16-Oct-1985  PCP: Milus Height, PA-C  Admit date: 01/25/2018 Discharge date:    Time spent: < 25 minutes  Admitted From: Home Disposition:  Home  Recommendations for Outpatient Follow-up:  1. Follow up with PCP in 1-2 weeks. 2. Discharged to continue Cipro and Flagyl.    Home Health:NO Equipment/Devices:None  Discharge Diagnoses:  Active Hospital Problems   Diagnosis Date Noted  . Distal Enteritis 01/25/2018  . Leucocytosis 01/25/2018    Resolved Hospital Problems  No resolved problems to display.    Discharge Condition: Stable  CODE STATUS:FULL Diet recommendation: Heart Healthy   Vitals:   01/28/18 0503 01/28/18 1348  BP: 110/73 (!) 104/56  Pulse: (!) 49 67  Resp: 16 18  Temp: 98.2 F (36.8 C) 98.8 F (37.1 C)  SpO2: 97% 98%    History of present illness:  Maureen Nelson is a 32 y.o. year old female with medical history significant for acute cholecystitis s/p laparoscopic cholecystectomy who presented on 01/25/2018 with abdominal pain for two days and associated with diarrhea, nausea and vomiting and was found to have distal ileum enteritis Remaining hospital course addressed in problem based format below:   Hospital Course:  Principal Problem:   Distal Enteritis Active Problems:   Leucocytosis   Distal ileum enteritis. ON presentation patient was afebrile and hemodynamically stable. Lab workup notable for negative GC/Chylmydia, UA, lipase, CMP.  Slight elevation in WBC at 12.4.CT abdomen confirmed ileal enteritis .  GI was consulted and recommended continue supportive care with antiemetics, analgesics, liquid diet and IV fluids. Presumed infectious process and patient's pain and diarrhea resolved with IV ciprofloxacin and flagyl. On day of discharge this was transitioned to oral cipro and flagyl and patient was able to tolerate a regular diet with minimal nausea.   Leukocytosis. Elevated at 12. 4  on admission and quickly downtrended during admission to normal. No fevers or other sepsis physiology.   Antibiotics: Ciprofloxacin 5/27 Flagyl 5/27  Microbiology: urine culture: multiple species, encourage to recollect  Consultations:  Gastroenterology    Procedures/Studies: US Transvaginal Non-ob  Result Date: 01/25/2018 CLINICAL DATA:  Mid abdominal pain EXAM: TRANSABDOMINAL AND TRANSVAGINAL ULTRASOUND OF PELVIS DOPPLER ULTRASOUND OF OVARIES TECHNIQUE: Both transabdominal and transvaginal ultrasound examinations of the pelvis were performed. Transabdominal technique was performed for global imaging of the pelvis including uterus, ovaries, adnexal regions, and pelvic cul-de-sac. It was necessary to proceed with endovaginal exam following the transabdominal exam to visualize the endometrium and ovaries. Color and duplex Doppler ultrasound was utilized to evaluate blood flow to the ovaries. COMPARISON:  None. FINDINGS: Uterus Measurements: 8.4 x 3.6 x 4.7 cm. No fibroids or other mass visualized. Endometrium Thickness: 5.8 mm.  No focal abnormality visualized. Right ovary Measurements: 2.8 x 2.3 x 2.1 cm. Normal appearance/no adnexal mass. Left ovary Measurements: 2.8 x 1.9 x 2.1 cm. Normal appearance/no adnexal mass. Pulsed Doppler evaluation of both ovaries demonstrates normal low-resistance arterial and venous waveforms. Other findings Moderate amount of pelvic free fluid. IMPRESSION: 1. No ovarian torsion. 2. Moderate amount of pelvic free fluid. Electronically Signed   By: Elige Ko   On: 01/25/2018 16:59   US Pelvis Complete  Result Date: 01/25/2018 CLINICAL DATA:  Mid abdominal pain EXAM: TRANSABDOMINAL AND TRANSVAGINAL ULTRASOUND OF PELVIS DOPPLER ULTRASOUND OF OVARIES TECHNIQUE: Both transabdominal and transvaginal ultrasound examinations of the pelvis were performed. Transabdominal technique was performed for global imaging of the pelvis including uterus, ovaries, adnexal regions,  and pelvic  cul-de-sac. It was necessary to proceed with endovaginal exam following the transabdominal exam to visualize the endometrium and ovaries. Color and duplex Doppler ultrasound was utilized to evaluate blood flow to the ovaries. COMPARISON:  None. FINDINGS: Uterus Measurements: 8.4 x 3.6 x 4.7 cm. No fibroids or other mass visualized. Endometrium Thickness: 5.8 mm.  No focal abnormality visualized. Right ovary Measurements: 2.8 x 2.3 x 2.1 cm. Normal appearance/no adnexal mass. Left ovary Measurements: 2.8 x 1.9 x 2.1 cm. Normal appearance/no adnexal mass. Pulsed Doppler evaluation of both ovaries demonstrates normal low-resistance arterial and venous waveforms. Other findings Moderate amount of pelvic free fluid. IMPRESSION: 1. No ovarian torsion. 2. Moderate amount of pelvic free fluid. Electronically Signed   By: Elige Ko   On: 01/25/2018 16:59   Ct Abdomen Pelvis W Contrast  Result Date: 01/25/2018 CLINICAL DATA:  Abdominal pain. Intermittent abdominal pain which has become constant. EXAM: CT ABDOMEN AND PELVIS WITH CONTRAST TECHNIQUE: Multidetector CT imaging of the abdomen and pelvis was performed using the standard protocol following bolus administration of intravenous contrast. CONTRAST:  OMNIPAQUE IOHEXOL 300 MG/ML  SOLN COMPARISON:  None. FINDINGS: Lower chest: No acute abnormality. Hepatobiliary: No focal liver abnormality is seen. Status post cholecystectomy. No biliary dilatation. Pancreas: Unremarkable. No pancreatic ductal dilatation or surrounding inflammatory changes. Spleen: Normal in size without focal abnormality. Adrenals/Urinary Tract: Adrenal glands are unremarkable. Kidneys are normal, without renal calculi, focal lesion, or hydronephrosis. Bladder is unremarkable. Stomach/Bowel: Stomach is within normal limits. Appendix appears normal. No bowel dilatation to suggest obstruction. Bowel wall thickening and air-fluid levels involving the distal ileum most concerning for  enteritis secondary to an infectious or inflammatory etiology. Small volume pelvic free fluid. Vascular/Lymphatic: No significant vascular findings are present. No enlarged abdominal or pelvic lymph nodes. Reproductive: Uterus and bilateral adnexa are unremarkable. Other: No abdominal wall hernia or abnormality. Musculoskeletal: No acute osseous abnormality. No aggressive osseous lesion. Posterior lumbar fusion at L5-S1. IMPRESSION: 1. Bowel wall thickening and air-fluid levels involving the distal ileum most concerning for enteritis secondary to an infectious or inflammatory etiology. Small amount of pelvic free fluid. Electronically Signed   By: Elige Ko   On: 01/25/2018 13:24   [place] Art/ven Flow Abd Pelv Doppler  Result Date: 01/25/2018 CLINICAL DATA:  Mid abdominal pain EXAM: TRANSABDOMINAL AND TRANSVAGINAL ULTRASOUND OF PELVIS DOPPLER ULTRASOUND OF OVARIES TECHNIQUE: Both transabdominal and transvaginal ultrasound examinations of the pelvis were performed. Transabdominal technique was performed for global imaging of the pelvis including uterus, ovaries, adnexal regions, and pelvic cul-de-sac. It was necessary to proceed with endovaginal exam following the transabdominal exam to visualize the endometrium and ovaries. Color and duplex Doppler ultrasound was utilized to evaluate blood flow to the ovaries. COMPARISON:  None. FINDINGS: Uterus Measurements: 8.4 x 3.6 x 4.7 cm. No fibroids or other mass visualized. Endometrium Thickness: 5.8 mm.  No focal abnormality visualized. Right ovary Measurements: 2.8 x 2.3 x 2.1 cm. Normal appearance/no adnexal mass. Left ovary Measurements: 2.8 x 1.9 x 2.1 cm. Normal appearance/no adnexal mass. Pulsed Doppler evaluation of both ovaries demonstrates normal low-resistance arterial and venous waveforms. Other findings Moderate amount of pelvic free fluid. IMPRESSION: 1. No ovarian torsion. 2. Moderate amount of pelvic free fluid. Electronically Signed   By: Elige Ko    On: 01/25/2018 16:59      Discharge Exam: BP (!) 104/56 (BP Location: Left Arm)   Pulse 67   Temp 98.8 F (37.1 C) (Oral)   Resp 18   Ht   (1.702 m)   Wt 93.3 kg (205 lb 11 oz)   SpO2 98%   BMI 32.22 kg/m   General: Lying in bed, no apparent distress Eyes: EOMI, anicteric ENT: Oral Mucosa clear and moist Cardiovascular: regular rate and rhythm, no murmurs, rubs or gallops, no edema, no JVD Respiratory: Normal respiratory effort, lungs clear to auscultation bilaterally Abdomen: soft, non-distended, non-tender, normal bowel sounds Skin: No Rash Musculoskeletal:Good ROM, no contractures. Normal muscle tone Neurologic: Grossly no focal neuro deficit.Mental status AAOx3, speech normal, Psychiatric:Appropriate affect, and mood   Discharge Instructions You were cared for by a hospitalist during your hospital stay. If you have any questions about your discharge medications or the care you received while you were in the hospital after you are discharged, you can call the unit and asked to speak with the hospitalist on call if the hospitalist that took care of you is not available. Once you are discharged, your primary care physician will handle any further medical issues. Please note that NO REFILLS for any discharge medications will be authorized once you are discharged, as it is imperative that you return to your primary care physician (or establish a relationship with a primary care physician if you do not have one) for your aftercare needs so that they can reassess your need for medications and monitor your lab values.  Discharge Instructions    Diet - low sodium heart healthy   Complete by:  As directed    Increase activity slowly   Complete by:  As directed      Allergies as of 01/28/2018      Reactions   Dicyclomine Hcl Rash      Medication List    TAKE these medications   acetaminophen 500 MG tablet Commonly known as:  TYLENOL Take 1,000 mg by mouth every 6 (six)  hours as needed for mild pain or headache.   albuterol 108 (90 Base) MCG/ACT inhaler Commonly known as:  PROVENTIL HFA;VENTOLIN HFA Inhale 1-2 puffs into the lungs every 6 (six) hours as needed for wheezing or shortness of breath.   ciprofloxacin 500 MG tablet Commonly known as:  CIPRO Take 1 tablet (500 mg total) by mouth 2 (two) times daily for 7 doses.   metroNIDAZOLE 500 MG tablet Commonly known as:  FLAGYL Take 1 tablet (500 mg total) by mouth every 8 (eight) hours for 11 doses.   norethindrone 0.35 MG tablet Commonly known as:  MICRONOR,CAMILA,ERRIN Take 1 tablet by mouth at bedtime.      Allergies  Allergen Reactions  . Dicyclomine Hcl Rash   Follow-up Information    Redmon, Noelle, PA-C.   Specialty:  Nurse Practitioner Contact information: [address] [place] [address] [phone]        [facility] EMERGENCY DEPARTMENT.   Specialty:  Emergency Medicine Why:  Return to the ER with any new or worsening symptoms Contact information: [address] [id number] mc [place] [address] [phone]           The results of significant diagnostics from this hospitalization (including imaging, microbiology, ancillary and laboratory) are listed below for reference.    Significant Diagnostic Studies: US Transvaginal Non-ob  Result Date: 01/25/2018 CLINICAL DATA:  Mid abdominal pain EXAM: TRANSABDOMINAL AND TRANSVAGINAL ULTRASOUND OF PELVIS DOPPLER ULTRASOUND OF OVARIES TECHNIQUE: Both transabdominal and transvaginal ultrasound examinations of the pelvis were performed. Transabdominal technique was performed for global imaging of the pelvis including uterus, ovaries, adnexal regions,  and pelvic cul-de-sac. It was necessary to proceed with endovaginal exam following the transabdominal exam to visualize the endometrium and ovaries. Color and duplex Doppler ultrasound was utilized to evaluate blood flow to the  ovaries. COMPARISON:  None. FINDINGS: Uterus Measurements: 8.4 x 3.6 x 4.7 cm. No fibroids or other mass visualized. Endometrium Thickness: 5.8 mm.  No focal abnormality visualized. Right ovary Measurements: 2.8 x 2.3 x 2.1 cm. Normal appearance/no adnexal mass. Left ovary Measurements: 2.8 x 1.9 x 2.1 cm. Normal appearance/no adnexal mass. Pulsed Doppler evaluation of both ovaries demonstrates normal low-resistance arterial and venous waveforms. Other findings Moderate amount of pelvic free fluid. IMPRESSION: 1. No ovarian torsion. 2. Moderate amount of pelvic free fluid. Electronically Signed   By: Elige Ko   On: 01/25/2018 16:59   US Pelvis Complete  Result Date: 01/25/2018 CLINICAL DATA:  Mid abdominal pain EXAM: TRANSABDOMINAL AND TRANSVAGINAL ULTRASOUND OF PELVIS DOPPLER ULTRASOUND OF OVARIES TECHNIQUE: Both transabdominal and transvaginal ultrasound examinations of the pelvis were performed. Transabdominal technique was performed for global imaging of the pelvis including uterus, ovaries, adnexal regions, and pelvic cul-de-sac. It was necessary to proceed with endovaginal exam following the transabdominal exam to visualize the endometrium and ovaries. Color and duplex Doppler ultrasound was utilized to evaluate blood flow to the ovaries. COMPARISON:  None. FINDINGS: Uterus Measurements: 8.4 x 3.6 x 4.7 cm. No fibroids or other mass visualized. Endometrium Thickness: 5.8 mm.  No focal abnormality visualized. Right ovary Measurements: 2.8 x 2.3 x 2.1 cm. Normal appearance/no adnexal mass. Left ovary Measurements: 2.8 x 1.9 x 2.1 cm. Normal appearance/no adnexal mass. Pulsed Doppler evaluation of both ovaries demonstrates normal low-resistance arterial and venous waveforms. Other findings Moderate amount of pelvic free fluid. IMPRESSION: 1. No ovarian torsion. 2. Moderate amount of pelvic free fluid. Electronically Signed   By: Elige Ko   On: 01/25/2018 16:59   Ct Abdomen Pelvis W  Contrast  Result Date: 01/25/2018 CLINICAL DATA:  Abdominal pain. Intermittent abdominal pain which has become constant. EXAM: CT ABDOMEN AND PELVIS WITH CONTRAST TECHNIQUE: Multidetector CT imaging of the abdomen and pelvis was performed using the standard protocol following bolus administration of intravenous contrast. CONTRAST:  OMNIPAQUE IOHEXOL 300 MG/ML  SOLN COMPARISON:  None. FINDINGS: Lower chest: No acute abnormality. Hepatobiliary: No focal liver abnormality is seen. Status post cholecystectomy. No biliary dilatation. Pancreas: Unremarkable. No pancreatic ductal dilatation or surrounding inflammatory changes. Spleen: Normal in size without focal abnormality. Adrenals/Urinary Tract: Adrenal glands are unremarkable. Kidneys are normal, without renal calculi, focal lesion, or hydronephrosis. Bladder is unremarkable. Stomach/Bowel: Stomach is within normal limits. Appendix appears normal. No bowel dilatation to suggest obstruction. Bowel wall thickening and air-fluid levels involving the distal ileum most concerning for enteritis secondary to an infectious or inflammatory etiology. Small volume pelvic free fluid. Vascular/Lymphatic: No significant vascular findings are present. No enlarged abdominal or pelvic lymph nodes. Reproductive: Uterus and bilateral adnexa are unremarkable. Other: No abdominal wall hernia or abnormality. Musculoskeletal: No acute osseous abnormality. No aggressive osseous lesion. Posterior lumbar fusion at L5-S1. IMPRESSION: 1. Bowel wall thickening and air-fluid levels involving the distal ileum most concerning for enteritis secondary to an infectious or inflammatory etiology. Small amount of pelvic free fluid. Electronically Signed   By: Elige Ko   On: 01/25/2018 13:24   [place] Art/ven Flow Abd Pelv Doppler  Result Date: 01/25/2018 CLINICAL DATA:  Mid abdominal pain EXAM: TRANSABDOMINAL AND TRANSVAGINAL ULTRASOUND OF PELVIS DOPPLER ULTRASOUND OF OVARIES TECHNIQUE: Both  transabdominal and  transvaginal ultrasound examinations of the pelvis were performed. Transabdominal technique was performed for global imaging of the pelvis including uterus, ovaries, adnexal regions, and pelvic cul-de-sac. It was necessary to proceed with endovaginal exam following the transabdominal exam to visualize the endometrium and ovaries. Color and duplex Doppler ultrasound was utilized to evaluate blood flow to the ovaries. COMPARISON:  None. FINDINGS: Uterus Measurements: 8.4 x 3.6 x 4.7 cm. No fibroids or other mass visualized. Endometrium Thickness: 5.8 mm.  No focal abnormality visualized. Right ovary Measurements: 2.8 x 2.3 x 2.1 cm. Normal appearance/no adnexal mass. Left ovary Measurements: 2.8 x 1.9 x 2.1 cm. Normal appearance/no adnexal mass. Pulsed Doppler evaluation of both ovaries demonstrates normal low-resistance arterial and venous waveforms. Other findings Moderate amount of pelvic free fluid. IMPRESSION: 1. No ovarian torsion. 2. Moderate amount of pelvic free fluid. Electronically Signed   By: Elige Ko   On: 01/25/2018 16:59    Microbiology: Recent Results (from the past 240 hour(s))  Wet prep, genital     Status: Abnormal   Collection Time: 01/25/18  4:11 PM  Result Value Ref Range Status   Yeast Wet Prep HPF POC NONE SEEN NONE SEEN Final   Trich, Wet Prep NONE SEEN NONE SEEN Final   Clue Cells Wet Prep HPF POC NONE SEEN NONE SEEN Final   WBC, Wet Prep HPF POC MANY (A) NONE SEEN Final   Sperm NONE SEEN  Final    Comment: Performed at [facility], [address], [place], [place] [address]  Urine culture     Status: Abnormal   Collection Time: 01/25/18  6:14 PM  Result Value Ref Range Status   Specimen Description URINE, RANDOM  Final   Special Requests   Final    NONE Performed at [facility], [address], [place], [place] [address]    Culture MULTIPLE SPECIES PRESENT, SUGGEST RECOLLECTION (A)  Final   Report Status 01/26/2018 FINAL  Final       Labs: Basic Metabolic Panel: Recent Labs  Lab 01/25/18 1103 01/26/18 0542 01/27/18 0542 01/28/18 0423  NA 139 140 142 141  K 4.2 3.4* 3.5 3.4*  CL 107 108 107 106  CO2 GLUCOSE 92 106* 79 82  BUN 11 7 <5* <5*  CREATININE 1.02* 0.92 1.02* 1.07*  CALCIUM 9.2 8.3* 8.7* 9.1   Liver Function Tests: Recent Labs  Lab 01/25/18 1103 01/26/18 0542  AST 19 16  ALT 22 15  ALKPHOS 98 76  BILITOT 1.1 1.1  PROT 7.4 5.9*  ALBUMIN 4.2 3.1*   Recent Labs  Lab 01/25/18 1103  LIPASE 30   No results for input(s): AMMONIA in the last 168 hours. CBC: Recent Labs  Lab 01/25/18 1103 01/26/18 0542 01/27/18 0542 01/28/18 0423  WBC 12.4* 4.8 4.2 5.5  HGB 14.9 11.8* 12.0 13.1  HCT 43.2 34.3* 35.0* 37.5  MCV 87.6 87.9 88.8 87.0  PLT 270 204 203 228   Cardiac Enzymes: No results for input(s): CKTOTAL, CKMB, CKMBINDEX, TROPONINI in the last 168 hours. BNP: BNP (last 3 results) No results for input(s): BNP in the last 8760 hours.  ProBNP (last 3 results) No results for input(s): PROBNP in the last 8760 hours.  CBG: No results for input(s): GLUCAP in the last 168 hours.     Signed:  Laverna Peace, MD [facility] 01/28/2018, 2:29 PM

## 2018-02-08 DIAGNOSIS — R062 Wheezing: Secondary | ICD-10-CM | POA: Diagnosis not present

## 2018-02-08 DIAGNOSIS — K529 Noninfective gastroenteritis and colitis, unspecified: Secondary | ICD-10-CM | POA: Diagnosis not present

## 2019-12-21 IMAGING — CT CT ABD-PELV W/ CM
2 of 4 series · 16 of 46 positions shown, 18 images · IV contrast (omnipaque)
Comparison: None.

CLINICAL DATA: Abdominal pain. Intermittent abdominal pain which
has become constant.

EXAM:
CT ABDOMEN AND PELVIS WITH CONTRAST
TECHNIQUE: Multidetector CT imaging of the abdomen and pelvis was performed
using the standard protocol following bolus administration of
intravenous contrast.
CONTRAST:  100mL OMNIPAQUE IOHEXOL 300 MG/ML  SOLN

[Series 3: a/p w/ 5mm · axial · 0.75mm/px · z∈[+856,+1301]mm · 13 of 99 slices shown, 15 images]
[im 5/99  soft-tissue]
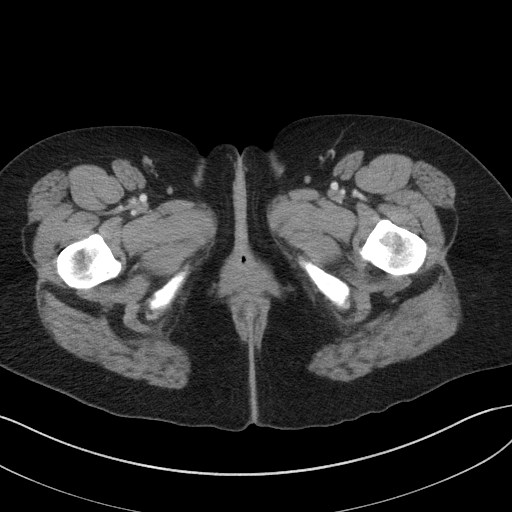
[im 5/99  bone]
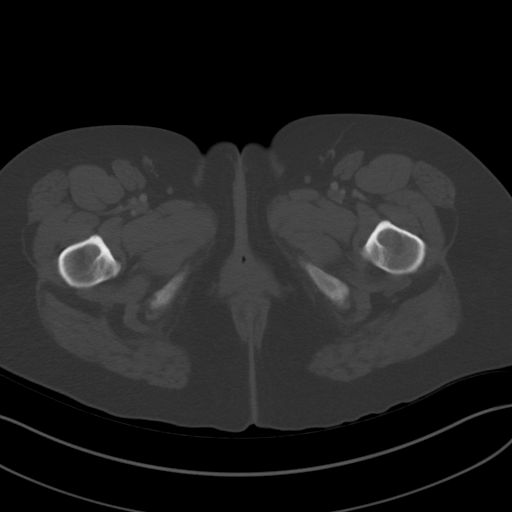
[im 13/99  soft-tissue]
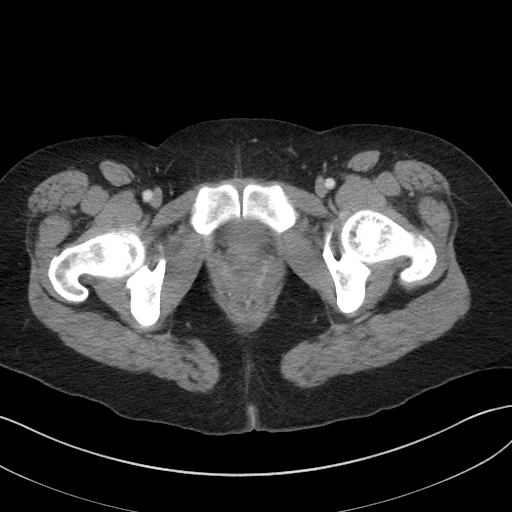
[im 21/99  soft-tissue]
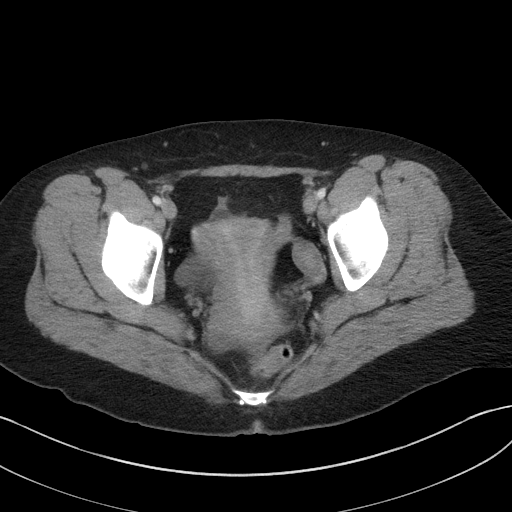
[im 29/99  soft-tissue]
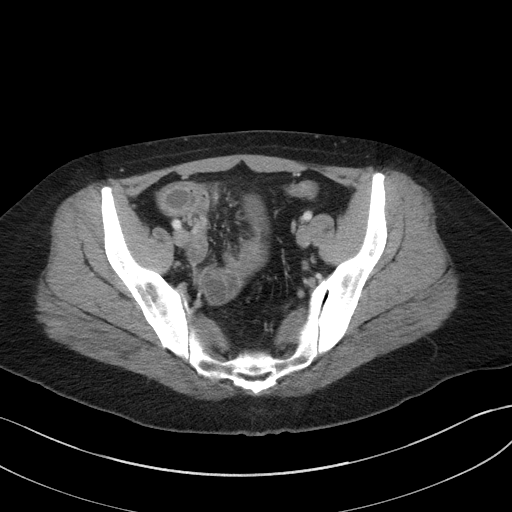
[im 33/99  soft-tissue]
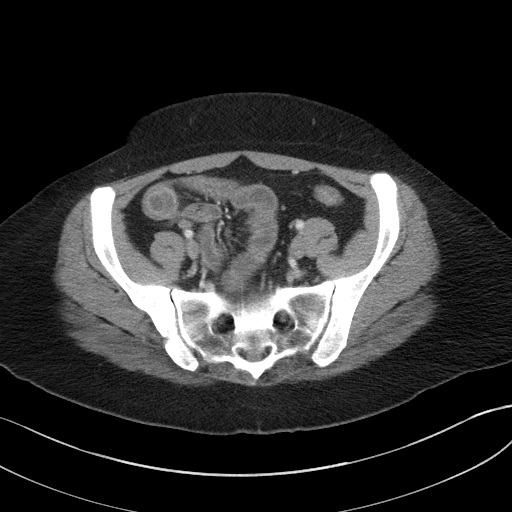
[im 41/99  soft-tissue]
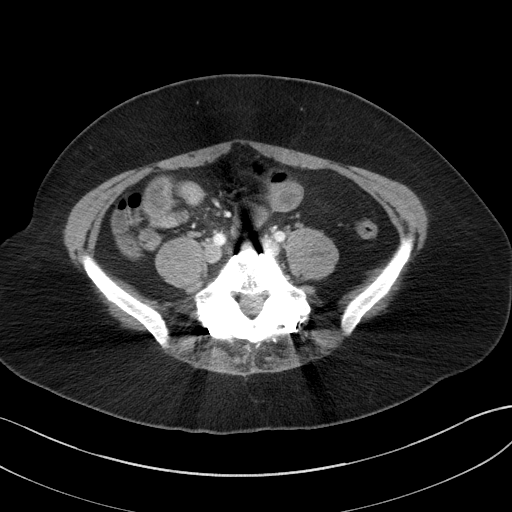
[im 50/99  soft-tissue]
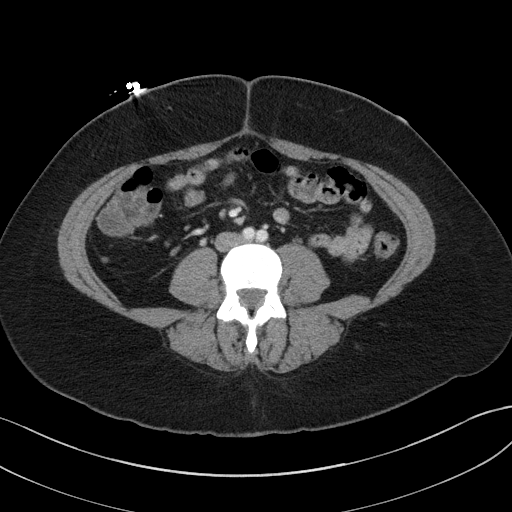
[im 58/99  soft-tissue]
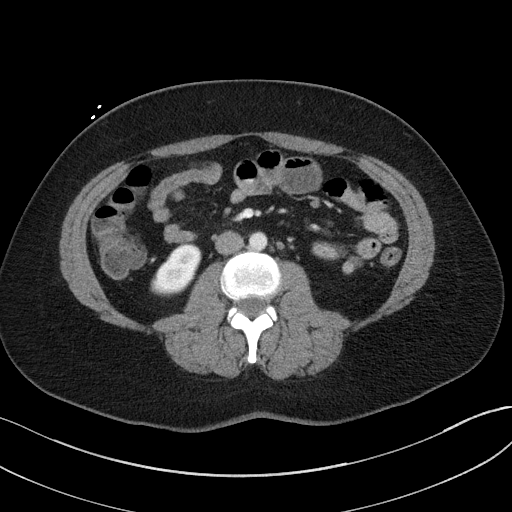
[im 66/99  soft-tissue]
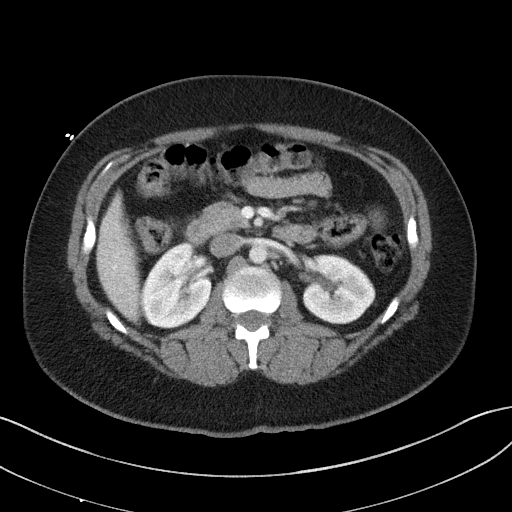
[im 66/99  bone]
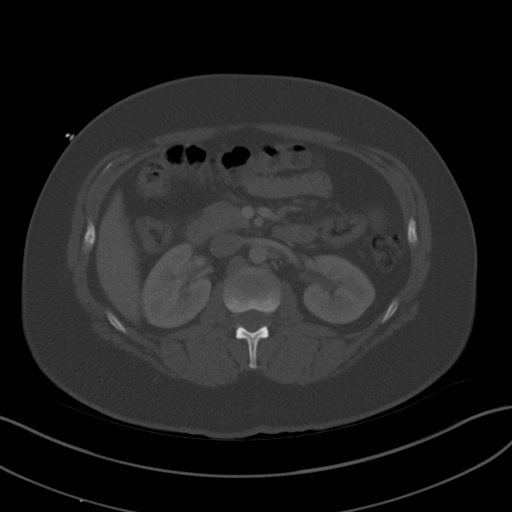
[im 70/99  soft-tissue]
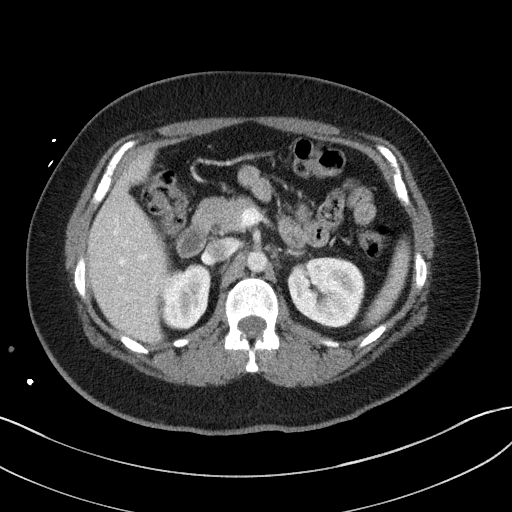
[im 78/99  soft-tissue]
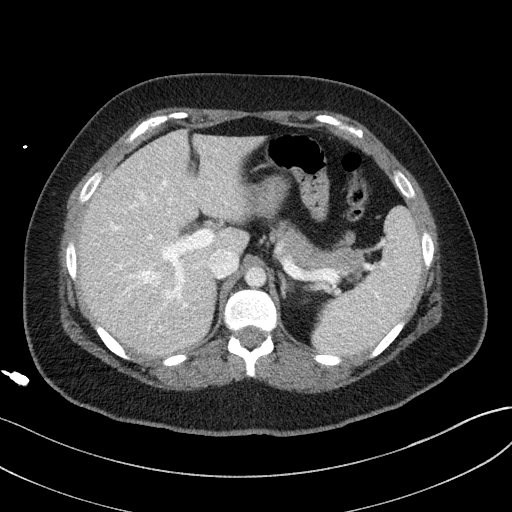
[im 86/99  soft-tissue]
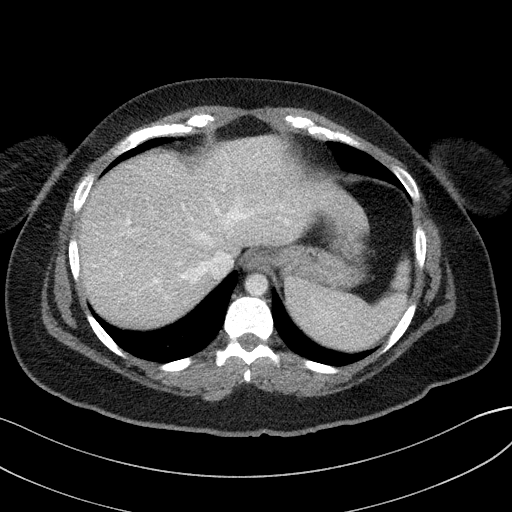
[im 94/99  soft-tissue]
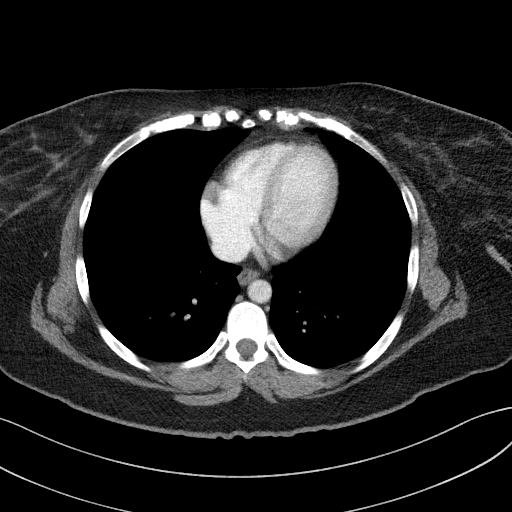

[Series 6: a/p w/ cor · coronal · 0.81mm/px · 3 of 119 slices shown]
[im 40/119  soft-tissue]
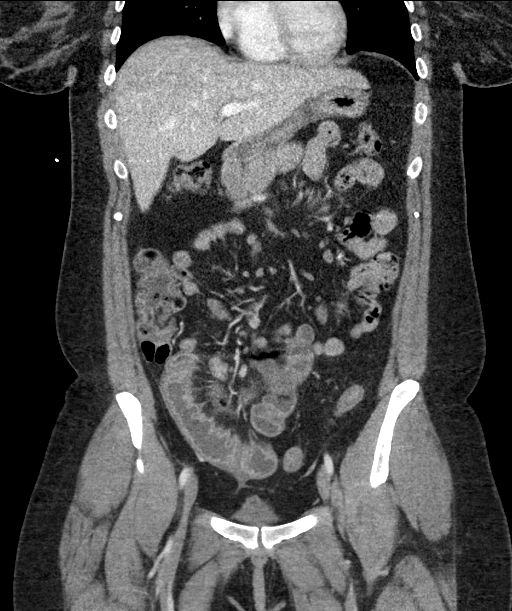
[im 53/119  soft-tissue]
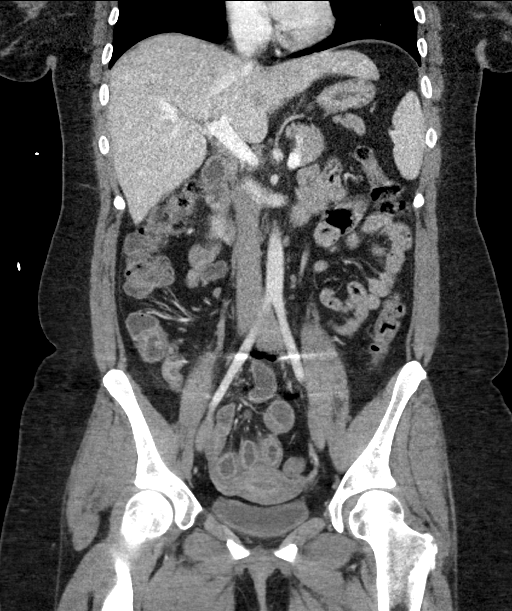
[im 66/119  soft-tissue]
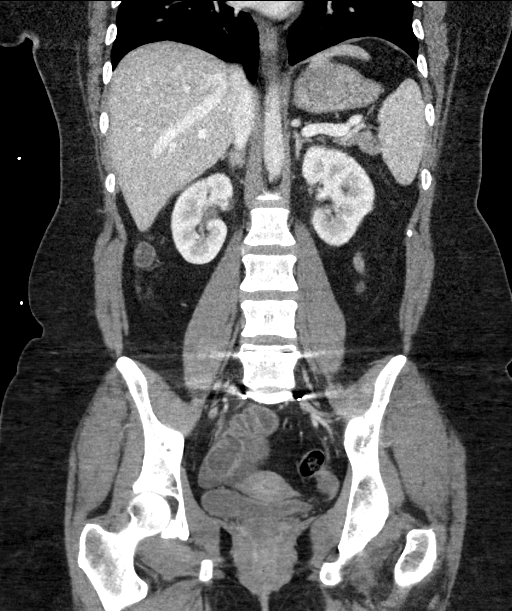

[16 of 46 positions shown; findings below may reference images not displayed]

FINDINGS: Lower chest: No acute abnormality.

Hepatobiliary: No focal liver abnormality is seen. Status post
cholecystectomy. No biliary dilatation.

Pancreas: Unremarkable. No pancreatic ductal dilatation or
surrounding inflammatory changes.

Spleen: Normal in size without focal abnormality.

Adrenals/Urinary Tract: Adrenal glands are unremarkable. Kidneys are
normal, without renal calculi, focal lesion, or hydronephrosis.
Bladder is unremarkable.

Stomach/Bowel: Stomach is within normal limits. Appendix appears
normal. No bowel dilatation to suggest obstruction. Bowel wall
thickening and air-fluid levels involving the distal ileum most
concerning for enteritis secondary to an infectious or inflammatory
etiology. Small volume pelvic free fluid.

Vascular/Lymphatic: No significant vascular findings are present. No
enlarged abdominal or pelvic lymph nodes.

Reproductive: Uterus and bilateral adnexa are unremarkable.

Other: No abdominal wall hernia or abnormality.

Musculoskeletal: No acute osseous abnormality. No aggressive osseous
lesion. Posterior lumbar fusion at L5-S1.
IMPRESSION: 1. Bowel wall thickening and air-fluid levels involving the distal
ileum most concerning for enteritis secondary to an infectious or
inflammatory etiology. Small amount of pelvic free fluid.

## 2021-01-22 ENCOUNTER — Other Ambulatory Visit: Payer: Self-pay | Admitting: Physician Assistant

## 2021-12-20 ENCOUNTER — Other Ambulatory Visit: Payer: Self-pay | Admitting: Rheumatology

## 2021-12-20 DIAGNOSIS — R7989 Other specified abnormal findings of blood chemistry: Secondary | ICD-10-CM

## 2021-12-24 ENCOUNTER — Ambulatory Visit
Admission: RE | Admit: 2021-12-24 | Discharge: 2021-12-24 | Disposition: A | Discharge status: Home / Self Care | Payer: 59 | Source: Ambulatory Visit | Attending: Rheumatology | Admitting: Rheumatology

## 2021-12-24 DIAGNOSIS — R7989 Other specified abnormal findings of blood chemistry: Secondary | ICD-10-CM

## 2022-11-04 ENCOUNTER — Emergency Department (HOSPITAL_BASED_OUTPATIENT_CLINIC_OR_DEPARTMENT_OTHER)
Admission: EM | Admit: 2022-11-04 | Discharge: 2022-11-05 | Disposition: A | Discharge status: Home / Self Care | Payer: 59 | Attending: Emergency Medicine | Admitting: Emergency Medicine

## 2022-11-04 ENCOUNTER — Other Ambulatory Visit (HOSPITAL_BASED_OUTPATIENT_CLINIC_OR_DEPARTMENT_OTHER): Payer: Self-pay

## 2022-11-04 ENCOUNTER — Emergency Department (HOSPITAL_BASED_OUTPATIENT_CLINIC_OR_DEPARTMENT_OTHER): Payer: 59

## 2022-11-04 ENCOUNTER — Encounter (HOSPITAL_BASED_OUTPATIENT_CLINIC_OR_DEPARTMENT_OTHER): Payer: Self-pay | Admitting: Emergency Medicine

## 2022-11-04 ENCOUNTER — Other Ambulatory Visit: Payer: Self-pay

## 2022-11-04 DIAGNOSIS — R112 Nausea with vomiting, unspecified: Secondary | ICD-10-CM | POA: Insufficient documentation

## 2022-11-04 DIAGNOSIS — R1031 Right lower quadrant pain: Secondary | ICD-10-CM

## 2022-11-04 DIAGNOSIS — N83201 Unspecified ovarian cyst, right side: Secondary | ICD-10-CM | POA: Insufficient documentation

## 2022-11-04 LAB — CBC WITH DIFFERENTIAL/PLATELET
Abs Immature Granulocytes: 0.01 10*3/uL (ref 0.00–0.07)
Basophils Absolute: 0.1 10*3/uL (ref 0.0–0.1)
Basophils Relative: 1 %
Eosinophils Absolute: 0.1 10*3/uL (ref 0.0–0.5)
Eosinophils Relative: 2 %
HCT: 39.5 % (ref 36.0–46.0)
Hemoglobin: 14.2 g/dL (ref 12.0–15.0)
Immature Granulocytes: 0 %
Lymphocytes Relative: 25 %
Lymphs Abs: 1.8 10*3/uL (ref 0.7–4.0)
MCH: 31.7 pg (ref 26.0–34.0)
MCHC: 35.9 g/dL (ref 30.0–36.0)
MCV: 88.2 fL (ref 80.0–100.0)
Monocytes Absolute: 0.7 10*3/uL (ref 0.1–1.0)
Monocytes Relative: 10 %
Neutro Abs: 4.5 10*3/uL (ref 1.7–7.7)
Neutrophils Relative %: 62 %
Platelets: 233 10*3/uL (ref 150–400)
RBC: 4.48 MIL/uL (ref 3.87–5.11)
RDW: 11.9 % (ref 11.5–15.5)
WBC: 7.2 10*3/uL (ref 4.0–10.5)
nRBC: 0 % (ref 0.0–0.2)

## 2022-11-04 LAB — COMPREHENSIVE METABOLIC PANEL
ALT: 32 U/L (ref 0–44)
AST: 27 U/L (ref 15–41)
Albumin: 4.8 g/dL (ref 3.5–5.0)
Alkaline Phosphatase: 65 U/L (ref 38–126)
Anion gap: 8 (ref 5–15)
BUN: 16 mg/dL (ref 6–20)
CO2: 25 mmol/L (ref 22–32)
Calcium: 10 mg/dL (ref 8.9–10.3)
Chloride: 104 mmol/L (ref 98–111)
Creatinine, Ser: 0.8 mg/dL (ref 0.44–1.00)
GFR, Estimated: >60 mL/min (ref >60)
Glucose, Bld: 79 mg/dL (ref 70–99)
Potassium: 4.2 mmol/L (ref 3.5–5.1)
Sodium: 137 mmol/L (ref 135–145)
Total Bilirubin: 1.2 mg/dL (ref 0.3–1.2)
Total Protein: 8.1 g/dL (ref 6.5–8.1)

## 2022-11-04 LAB — LIPASE, BLOOD: Lipase: 18 U/L (ref 11–51)

## 2022-11-04 LAB — PREGNANCY, URINE: Preg Test, Ur: NEGATIVE

## 2022-11-04 MED ORDER — HYDROMORPHONE HCL 1 MG/ML IJ SOLN
1.0000 mg | Freq: Once | INTRAMUSCULAR | Status: AC
  Administered 2022-11-04: 1 mg via INTRAVENOUS
  Filled 2022-11-04: qty 1

## 2022-11-04 MED ORDER — DIPHENHYDRAMINE HCL 50 MG/ML IJ SOLN
50.0000 mg | Freq: Once | INTRAMUSCULAR | Status: AC
  Administered 2022-11-04: 50 mg via INTRAVENOUS
  Filled 2022-11-04: qty 1

## 2022-11-04 MED ORDER — MORPHINE SULFATE (PF) 4 MG/ML IV SOLN
4.0000 mg | Freq: Once | INTRAVENOUS | Status: AC
  Administered 2022-11-04: 4 mg via INTRAVENOUS
  Filled 2022-11-04: qty 1

## 2022-11-04 MED ORDER — FENTANYL CITRATE PF 50 MCG/ML IJ SOSY
50.0000 ug | PREFILLED_SYRINGE | Freq: Once | INTRAMUSCULAR | Status: AC
  Administered 2022-11-04: 50 ug via INTRAVENOUS
  Filled 2022-11-04: qty 1

## 2022-11-04 MED ORDER — ONDANSETRON HCL 4 MG/2ML IJ SOLN
4.0000 mg | Freq: Once | INTRAMUSCULAR | Status: AC
  Administered 2022-11-04: 4 mg via INTRAVENOUS
  Filled 2022-11-04: qty 2

## 2022-11-04 MED ORDER — OXYCODONE-ACETAMINOPHEN 5-325 MG PO TABS
1.0000 | ORAL_TABLET | Freq: Once | ORAL | Status: AC
  Administered 2022-11-04: 1 via ORAL
  Filled 2022-11-04: qty 1

## 2022-11-04 MED ORDER — IOHEXOL 300 MG/ML  SOLN
100.0000 mL | Freq: Once | INTRAMUSCULAR | Status: AC | PRN
  Administered 2022-11-04: 85 mL via INTRAVENOUS

## 2022-11-04 MED ORDER — KETOROLAC TROMETHAMINE 30 MG/ML IJ SOLN
30.0000 mg | Freq: Once | INTRAMUSCULAR | Status: AC
  Administered 2022-11-04: 30 mg via INTRAVENOUS
  Filled 2022-11-04: qty 1

## 2022-11-04 MED ORDER — ONDANSETRON 4 MG PO TBDP
4.0000 mg | ORAL_TABLET | Freq: Once | ORAL | Status: AC
  Administered 2022-11-04: 4 mg via ORAL
  Filled 2022-11-04: qty 1

## 2022-11-04 MED ORDER — ONDANSETRON 4 MG PO TBDP: 4.0000 mg | ORAL_TABLET | Freq: Three times a day (TID) | ORAL | 0 refills | Status: AC | PRN

## 2022-11-04 MED ORDER — OXYCODONE-ACETAMINOPHEN 5-325 MG PO TABS: 1.0000 | ORAL_TABLET | ORAL | 0 refills | Status: AC | PRN

## 2022-11-04 MED ORDER — LACTATED RINGERS IV BOLUS
1000.0000 mL | Freq: Once | INTRAVENOUS | Status: AC
  Administered 2022-11-04: 1000 mL via INTRAVENOUS

## 2022-11-04 NOTE — Discharge Instructions (Addendum)
You were seen for right lower abdominal pain and found to have a 2.8 cm right ovarian cyst. All of your lab work returned normal. No evidence of other etiology at this time. You received pain medication with some improvement in your pain.   Please use the pain medicine and nausea medicine to help with your symptoms.  We would recommend follow up with OBGYN if your pain recurs.   If your symptoms change or worsen acutely, would recommend follow-up with the nearest emergency department.  Return if unable to keep fluids down, pain worsens, you have fevers.

## 2022-11-04 NOTE — ED Notes (Signed)
Pt attempted to ambulate to restroom, c/o dizziness. Commode placed in pt room, s/o at bedside.

## 2022-11-04 NOTE — ED Triage Notes (Signed)
Pt report RLQ abdominal pain that started Sunday night. Pt reports since that time the pain has been getting worse. Pt reports nausea. Pain is worse when walking.

## 2022-11-04 NOTE — ED Notes (Signed)
Pt sts that pain is now 7/10 and endorses nausea. Pt sts that pain meds have been working for a little while and dilaudid has worked the best compared the other meds given (see MAR). Pt alert and oriented x 4.

## 2022-11-04 NOTE — ED Notes (Signed)
Patient placed on [facility] following pain medication administration d/t desaturations.

## 2022-11-04 NOTE — ED Provider Notes (Signed)
3:34 PM Care assumed from Dr. Maryan Rued.  At time of transfer of care, patient is awaiting reassessment after more pain medicine and p.o. challenge for right lower quadrant abdominal pain that had reassuring CT and ultrasound ruling out appendicitis and acute torsion at this time.  After the pelvic ultrasound her pain worsened and suspect that the ultrasound being performed may have exacerbated the pain.  When patient is feeling better, plan of care is discharge home and follow-up with OB team and PCP.  Anticipate discharge after reassessment.  Patient continues to have severe pain that she cannot tolerate.  Was given Dilaudid.  Patient had some improvement but then worsened again.  Will give another dose of Dilaudid and Zofran.  I had a long conversation with patient family going through all of the workup findings.  I suspect that she is having some pain from the ovarian cyst but there is no evidence of torsion on the ultrasound.  At this time do not feel there is something to admit to the hospital given her reassuring vitals and workup thus far.  Will again try more pain and nausea medicine to see if we can get her feeling well enough to be able to be discharged for outpatient OB/GYN follow-up and outpatient management with medications.  11:16 PM Patient continues to have waxing waning symptoms.  We will give her an oral dose of pain medicine and nausea medicine and if she tolerates this and her reassuring vital signs and otherwise reassuring workup will discharge with plans for outpatient pain medicine and nausea medicine and follow-up with her OB/GYN team.     Maureen Nelson, Gwenyth Allegra, MD 11/04/22 [phone]

## 2022-11-04 NOTE — ED Provider Notes (Signed)
[place] EMERGENCY DEPARTMENT AT [facility] Provider Note   CSN: [id number] Arrival date & time: 11/04/22  1020     History  Chief Complaint  Patient presents with   Abdominal Pain    NADIE SICINSKI is a 37 y.o. female.  Ms. Pinkos states that she started to have generalized abdominal pain on Sunday night.  The following day (yesterday) the pain localized more so to her right lower quadrant.  At that time it was intermittent.  She describes the pain as feeling achy when she is still, sharp when she is moving around.  The pain is now more constant.  She was seen at the [facility] this morning and they referred her to the emergency department for further evaluation.  She denies any fever, vomiting but does feel nauseous.  She has tried Gas-X for her pain and Tylenol without much relief.  Denies any recent illness, travel.  Last bowel movement was this morning which was normal.  She has history of cholecystectomy in 2014 and spinal fusion. Reports no possibility of pregnancy given husband has had a vasectomy.       Home Medications Prior to Admission medications   Medication Sig Start Date End Date Taking? Authorizing Provider  acetaminophen (TYLENOL) 500 MG tablet Take 1,000 mg by mouth every 6 (six) hours as needed for mild pain or headache.    [provider]  albuterol (PROVENTIL HFA;VENTOLIN HFA) 108 (90 Base) MCG/ACT inhaler Inhale 1-2 puffs into the lungs every 6 (six) hours as needed for wheezing or shortness of breath.    [provider]  norethindrone (MICRONOR,CAMILA,ERRIN) 0.35 MG tablet Take 1 tablet by mouth at bedtime.  01/23/18   [provider]      Allergies    Dicyclomine hcl    Review of Systems   Review of Systems  Constitutional:  Negative for fever.  Respiratory:  Negative for shortness of breath.   Cardiovascular:  Negative for chest pain.  Gastrointestinal:  Positive for abdominal pain and nausea. Negative for  constipation, diarrhea and vomiting.  Genitourinary:  Negative for dysuria.    Physical Exam Updated Vital Signs BP 128/89   Pulse 77   Temp 98.5 F (36.9 C) (Oral)   Resp 18   SpO2 96%  Physical Exam Constitutional:      Appearance: She is well-developed.     Comments: +uncomfortable appearing  Cardiovascular:     Rate and Rhythm: Normal rate and regular rhythm.     Heart sounds: Normal heart sounds. No murmur heard. Pulmonary:     Effort: Pulmonary effort is normal.     Breath sounds: Normal breath sounds. No stridor. No wheezing or rhonchi.  Abdominal:     General: Bowel sounds are normal. There is no distension.     Palpations: Abdomen is soft.     Tenderness: There is abdominal tenderness in the right lower quadrant and periumbilical area. Positive signs include McBurney's sign.     Hernia: No hernia is present.  Skin:    Capillary Refill: Capillary refill takes less than 2 seconds.  Neurological:     General: No focal deficit present.     Mental Status: She is alert.  Psychiatric:        Mood and Affect: Mood normal.        Behavior: Behavior normal.     ED Results / Procedures / Treatments   Labs (all labs ordered are listed, but only abnormal results are displayed)  Labs Reviewed  CBC WITH DIFFERENTIAL/PLATELET  COMPREHENSIVE METABOLIC PANEL  LIPASE, BLOOD  PREGNANCY, URINE    EKG None  Radiology US PELVIC COMPLETE W TRANSVAGINAL AND TORSION R/O  Result Date: 11/04/2022 CLINICAL DATA:  Ovarian cyst EXAM: TRANSABDOMINAL AND TRANSVAGINAL ULTRASOUND OF PELVIS DOPPLER ULTRASOUND OF OVARIES TECHNIQUE: Both transabdominal and transvaginal ultrasound examinations of the pelvis were performed. Transabdominal technique was performed for global imaging of the pelvis including uterus, ovaries, adnexal regions, and pelvic cul-de-sac. It was necessary to proceed with endovaginal exam following the transabdominal exam to visualize the ovaries/adnexa. Color and duplex  Doppler ultrasound was utilized to evaluate blood flow to the ovaries. COMPARISON:  Same day CT, ultrasound 01/25/2018 FINDINGS: Uterus Measurements: 9.8 x 4.0 x 4.7 cm = volume: 97.8 mL. No fibroids or other mass visualized. Endometrium Thickness: 10.6 mm, normal for age. No focal abnormality visualized. Right ovary Measurements: 3.7 x 3.3 x 3.1 cm = volume: 20.3 mL. There is a dominant follicle measuring 2.7 x 2.2 x 2.8 cm. Left ovary Measurements: 3.7 x 2.0 x 2.8 cm = volume: 10.9 mL. Normal appearance/no adnexal mass. Pulsed Doppler evaluation of both ovaries demonstrates normal low-resistance arterial and venous waveforms. Other findings No abnormal free fluid. IMPRESSION: Dominant follicle in the right ovary measuring up to 2.8 cm. No evidence of ovarian torsion. Electronically Signed   By: Caprice Renshaw M.D.   On: 11/04/2022 14:27   CT ABDOMEN PELVIS W CONTRAST  Result Date: 11/04/2022 CLINICAL DATA:  Right lower quadrant pain prior cholecystectomy and lumbar surgery. EXAM: CT ABDOMEN AND PELVIS WITH CONTRAST TECHNIQUE: Multidetector CT imaging of the abdomen and pelvis was performed using the standard protocol following bolus administration of intravenous contrast. RADIATION DOSE REDUCTION: This exam was performed according to the departmental dose-optimization program which includes automated exposure control, adjustment of the mA and/or kV according to patient size and/or use of iterative reconstruction technique. CONTRAST:  85mL OMNIPAQUE IOHEXOL 300 MG/ML  SOLN COMPARISON:  CT 01/25/2018.  Ultrasound 12/24/2021 FINDINGS: Lower chest: Breathing motion along the lung bases. No pleural effusion. Hepatobiliary: Mild fatty liver infiltration. No space-occupying liver lesion. Patent portal vein. Gallbladder is absent as per history. Pancreas: Unremarkable. No pancreatic ductal dilatation or surrounding inflammatory changes. Spleen: Normal in size without focal abnormality. Adrenals/Urinary Tract: The adrenal  glands are preserved. No enhancing renal mass or collecting system dilatation. The ureters have normal course and caliber dome of the bladder. Preserved contours of the urinary bladder. Stomach/Bowel: Moderate colonic stool on the right side. The large bowel is nondilated. Normal retrocecal appendix. Stomach and small bowel are nondilated. Vascular/Lymphatic: Normal caliber aorta and IVC. No specific abnormal lymph node enlargement identified in the abdomen and pelvis. Small nodes in the upper retroperitoneum are not pathologic by size criteria. Reproductive: Uterus is present. There is cystic lesion in the right ovary measuring 2.8 by 2.1 cm this was not seen on the prior. Other: No free fluid or free air. Musculoskeletal: Streak artifact related to the fixation hardware along the lumbosacral spine at L5-S1. IMPRESSION: No bowel obstruction, free air or free fluid.  Normal appendix. 2.8 cm right ovarian cystic lesion, new from the study of 2019. No follow-up imaging recommended in the absence of symptoms. If patient is particularly symptomatic and ultrasound could be performed to further assess the right ovary. Note: This recommendation does not apply to premenarchal patients and to those with increased risk (genetic, family history, elevated tumor markers or other high-risk factors) of ovarian cancer. Reference: JACR 2020 Feb;  17(2):248-254 Electronically Signed   By: Karen Kays M.D.   On: 11/04/2022 12:27    Procedures Procedures    Medications Ordered in ED Medications  ondansetron (ZOFRAN) injection 4 mg (4 mg Intravenous Given 11/04/22 1127)  lactated ringers bolus 1,000 mL (0 mLs Intravenous Stopped 11/04/22 1330)  ketorolac (TORADOL) 30 MG/ML injection 30 mg (30 mg Intravenous Given 11/04/22 1218)  iohexol (OMNIPAQUE) 300 MG/ML solution 100 mL (85 mLs Intravenous Contrast Given 11/04/22 1211)  morphine (PF) 4 MG/ML injection 4 mg (4 mg Intravenous Given 11/04/22 1408)  fentaNYL (SUBLIMAZE) injection 50  mcg (50 mcg Intravenous Given 11/04/22 1502)  fentaNYL (SUBLIMAZE) injection 50 mcg (50 mcg Intravenous Given 11/04/22 1544)    ED Course/ Medical Decision Making/ A&P                             Medical Decision Making 10:53 AM 37 y/o F with PSHx cholecystectomy who presents with worsening abdominal pain now localized to RLQ with associated nausea. Etiology most concerning for appendicitis given history and examination. Other differentials include ectopic pregnancy (feel less likely given husband with vasectomy but will check urine preg), ovarian torsion, cystitis (unlikely in setting of no urinary sx and pt report of normal UA at UC visit this AM), abdominal abscess (unlikely without fever), gastroenteritis (doubt given no V/D). Will given 1L LR bolus, check urine pregnancy, check CBC, CMP, lipase. Will give anti-emetic. Patient requests to hold off on pain medication at this time, advised her to let us know if she changes her mind. Will proceed with CT abd/pelv to evaluate source of pain if urine pregnancy returns negative.   11:40 AM Upreg negative. CBC stable without anemia or leukocytosis. Awaiting transport for CT.   11:56 AM re-evaluated patient. Reports nausea is improved. Pain is tolerable if she is not moving. She is amenable to pain medication at this time but would prefer something that is "not too strong". Discussed trial of IV NSAID. She has no allergies to NSAID and no symptoms that concern me for GIB (normal Hgb). Will order IV Toradol. Will re-eval and update after CT scan.   12:36 PM CT returned negative for appendicitis.  She does have a 2.8 cm right ovarian cystic lesion that is new since 2019.  Updated patient on results.  Given that she is symptomatic, we will proceed with pelvic/transvaginal ultrasound. Lipase and CMP unremarkable. Pain is slightly improved s/p Toradol.   2:57 PM in to re-evaluate patient. More uncomfortable, states pain worsened <10 minutes ago. Without nausea.  Pain is localized to suprapubic and RLQ/adnexa. Will re-order pain medication. U/S returned without evidence of torsion. Shows evidence of dominant follicle in right ovary measuring up to 2.8 cm, consistent with CT. All lab work thus far has returned reassuring.   3:19 PM in to re-evaluate patient. Reports pain improved to 6/10 (previously 9-10/10). Updated on U/S results. Unclear etiology as labs and imaging are reassuring- have only demonstrated right ovarian cyst which is the same location of her pain. Vitals still stable. Will give an additional dose of fentanyl to see if pain improves. Anticipate will be able to d/c home with pain medication and OBGYN follow up if recurs.   4:00 PM in to re-evaluate patient. Still having 6/10 pain but feels the fentanyl has helped to relax her. Will sign out care to Dr. Rush Landmark. Given reassuring workup still feel likely dispo will be home with short course of  pain medication and GYN f/u.   Amount and/or Complexity of Data Reviewed Labs: ordered. Radiology: ordered.  Risk Prescription drug management.          Final Clinical Impression(s) / ED Diagnoses Final diagnoses:  Right ovarian cyst    Rx / DC Orders ED Discharge Orders     None         Sabino Dick, DO 11/04/22 1601    Gwyneth Sprout, MD 11/05/22 2214

## 2022-11-05 ENCOUNTER — Encounter (HOSPITAL_COMMUNITY): Payer: Self-pay | Admitting: *Deleted

## 2022-11-05 ENCOUNTER — Other Ambulatory Visit: Payer: Self-pay

## 2022-11-05 ENCOUNTER — Emergency Department (HOSPITAL_COMMUNITY)
Admission: EM | Admit: 2022-11-05 | Discharge: 2022-11-06 | Disposition: A | Discharge status: Home / Self Care | Payer: 59 | Source: Home / Self Care | Attending: Emergency Medicine | Admitting: Emergency Medicine

## 2022-11-05 DIAGNOSIS — R112 Nausea with vomiting, unspecified: Secondary | ICD-10-CM | POA: Insufficient documentation

## 2022-11-05 DIAGNOSIS — R1031 Right lower quadrant pain: Secondary | ICD-10-CM | POA: Insufficient documentation

## 2022-11-05 LAB — COMPREHENSIVE METABOLIC PANEL
ALT: 37 U/L (ref 0–44)
AST: 31 U/L (ref 15–41)
Albumin: 4 g/dL (ref 3.5–5.0)
Alkaline Phosphatase: 59 U/L (ref 38–126)
Anion gap: 10 (ref 5–15)
BUN: 10 mg/dL (ref 6–20)
CO2: 25 mmol/L (ref 22–32)
Calcium: 9.1 mg/dL (ref 8.9–10.3)
Chloride: 102 mmol/L (ref 98–111)
Creatinine, Ser: 0.91 mg/dL (ref 0.44–1.00)
GFR, Estimated: >60 mL/min (ref >60)
Glucose, Bld: 101 mg/dL — ABNORMAL HIGH (ref 70–99)
Potassium: 3.6 mmol/L (ref 3.5–5.1)
Sodium: 137 mmol/L (ref 135–145)
Total Bilirubin: 1.1 mg/dL (ref 0.3–1.2)
Total Protein: 7 g/dL (ref 6.5–8.1)

## 2022-11-05 LAB — CBC WITH DIFFERENTIAL/PLATELET
Abs Immature Granulocytes: 0.02 10*3/uL (ref 0.00–0.07)
Basophils Absolute: 0.1 10*3/uL (ref 0.0–0.1)
Basophils Relative: 1 %
Eosinophils Absolute: 0.1 10*3/uL (ref 0.0–0.5)
Eosinophils Relative: 2 %
HCT: 36.1 % (ref 36.0–46.0)
Hemoglobin: 13.3 g/dL (ref 12.0–15.0)
Immature Granulocytes: 0 %
Lymphocytes Relative: 30 %
Lymphs Abs: 1.7 10*3/uL (ref 0.7–4.0)
MCH: 32.3 pg (ref 26.0–34.0)
MCHC: 36.8 g/dL — ABNORMAL HIGH (ref 30.0–36.0)
MCV: 87.6 fL (ref 80.0–100.0)
Monocytes Absolute: 0.7 10*3/uL (ref 0.1–1.0)
Monocytes Relative: 12 %
Neutro Abs: 3.1 10*3/uL (ref 1.7–7.7)
Neutrophils Relative %: 55 %
Platelets: 225 10*3/uL (ref 150–400)
RBC: 4.12 MIL/uL (ref 3.87–5.11)
RDW: 11.7 % (ref 11.5–15.5)
WBC: 5.6 10*3/uL (ref 4.0–10.5)
nRBC: 0 % (ref 0.0–0.2)

## 2022-11-05 LAB — LIPASE, BLOOD: Lipase: 39 U/L (ref 11–51)

## 2022-11-05 MED ORDER — ONDANSETRON HCL 4 MG/2ML IJ SOLN
4.0000 mg | Freq: Once | INTRAMUSCULAR | Status: AC
  Administered 2022-11-06: 4 mg via INTRAVENOUS
  Filled 2022-11-05: qty 2

## 2022-11-05 MED ORDER — SODIUM CHLORIDE 0.9 % IV BOLUS (SEPSIS)
1000.0000 mL | Freq: Once | INTRAVENOUS | Status: AC
  Administered 2022-11-05: 1000 mL via INTRAVENOUS

## 2022-11-05 MED ORDER — FENTANYL CITRATE PF 50 MCG/ML IJ SOSY
100.0000 ug | PREFILLED_SYRINGE | Freq: Once | INTRAMUSCULAR | Status: AC
  Administered 2022-11-05: 100 ug via INTRAVENOUS
  Filled 2022-11-05: qty 2

## 2022-11-05 MED ORDER — ONDANSETRON HCL 4 MG/2ML IJ SOLN
4.0000 mg | Freq: Once | INTRAMUSCULAR | Status: AC
  Administered 2022-11-05: 4 mg via INTRAVENOUS

## 2022-11-05 NOTE — ED Provider Triage Note (Signed)
Emergency Medicine Provider Triage Evaluation Note  Maureen Nelson , a 37 y.o. female  was evaluated in triage.  Pt complains of suprapubic and right lower quadrant abdominal pain.  Was seen at [facility] yesterday for same.  States he has not gotten any better.  She endorses continued nausea and vomiting from yesterday.  Saw oncology today for her ovarian cyst and states that it was not large enough to cause her symptoms.  Review of Systems  Positive: As above Negative: As above  Physical Exam  BP (!) 134/98   Pulse 98   Temp 97.8 F (36.6 C)   Resp 17   Ht '5\' 7"'$  (1.702 m)   Wt 93.3 kg   SpO2 97%   BMI 32.22 kg/m  Gen:   Awake, significant distress   Resp:  Normal effort  MSK:   Moves extremities without difficulty  Other:  Diffuse abdominal tenderness to palpation  Medical Decision Making  Medically screening exam initiated at 8:44 PM.  Appropriate orders placed.  Maureen Nelson was informed that the remainder of the evaluation will be completed by another provider, this initial triage assessment does not replace that evaluation, and the importance of remaining in the ED until their evaluation is complete.  Pain out of proportion to exam, question mesenteric ischemia?   Maureen Nelson, [place] 11/05/22 2045

## 2022-11-05 NOTE — ED Triage Notes (Signed)
The pt is c/o abd pain for 2-3 days with n and v  she was seen [facility] and her regfular doctor and she is still vomiting and pain  .  Jan 3rd

## 2022-11-05 NOTE — ED Notes (Signed)
Reviewed AVS with patient, patient expressed understanding of directions, denies further questions at this time.

## 2022-11-06 ENCOUNTER — Emergency Department (HOSPITAL_COMMUNITY): Payer: 59

## 2022-11-06 LAB — URINALYSIS, ROUTINE W REFLEX MICROSCOPIC
Bilirubin Urine: NEGATIVE
Glucose, UA: NEGATIVE mg/dL
Hgb urine dipstick: NEGATIVE
Ketones, ur: NEGATIVE mg/dL
Leukocytes,Ua: NEGATIVE
Nitrite: NEGATIVE
Protein, ur: NEGATIVE mg/dL
Specific Gravity, Urine: 1.036 — ABNORMAL HIGH (ref 1.005–1.030)
pH: 6 (ref 5.0–8.0)

## 2022-11-06 LAB — LACTIC ACID, PLASMA: Lactic Acid, Venous: 1.2 mmol/L (ref 0.5–1.9)

## 2022-11-06 LAB — TROPONIN I (HIGH SENSITIVITY): Troponin I (High Sensitivity): 2 ng/L (ref <18)

## 2022-11-06 MED ORDER — FENTANYL CITRATE PF 50 MCG/ML IJ SOSY
100.0000 ug | PREFILLED_SYRINGE | Freq: Once | INTRAMUSCULAR | Status: AC
  Administered 2022-11-06: 100 ug via INTRAVENOUS
  Filled 2022-11-06: qty 2

## 2022-11-06 MED ORDER — KETOROLAC TROMETHAMINE 15 MG/ML IJ SOLN
15.0000 mg | Freq: Once | INTRAMUSCULAR | Status: AC
  Administered 2022-11-06: 15 mg via INTRAVENOUS
  Filled 2022-11-06: qty 1

## 2022-11-06 MED ORDER — ONDANSETRON HCL 4 MG/2ML IJ SOLN
4.0000 mg | Freq: Once | INTRAMUSCULAR | Status: DC
  Filled 2022-11-06: qty 2

## 2022-11-06 MED ORDER — IOHEXOL 350 MG/ML SOLN
75.0000 mL | Freq: Once | INTRAVENOUS | Status: AC | PRN
  Administered 2022-11-06: 75 mL via INTRAVENOUS

## 2022-11-06 MED ORDER — HYDROMORPHONE HCL 1 MG/ML IJ SOLN
0.5000 mg | Freq: Once | INTRAMUSCULAR | Status: AC
  Administered 2022-11-06: 0.5 mg via INTRAVENOUS
  Filled 2022-11-06: qty 1

## 2022-11-06 NOTE — ED Notes (Signed)
Pt states she is having epigastric pain that feel like pressure. RN notified EDP

## 2022-11-06 NOTE — ED Provider Notes (Signed)
[facility] Provider Note   CSN: [id number] Arrival date & time: 11/05/22  1958     History  Chief Complaint  Patient presents with   Abdominal Pain    Maureen Nelson is a 37 y.o. female.   Abdominal Pain Associated symptoms: no fever, no vaginal bleeding and no vaginal discharge    Patient reports onset of abdominal pain around March 4.  She reports is mostly in the right side.  It was initially intermittent and then became severe and constant.  She reports associated nausea and vomiting, but no diarrhea.  She has had a normal bowel movement.  Nonbloody emesis, no blood in her stool  no vaginal bleeding or discharge.  No dysuria.  She has previous history of cholecystectomy.  Patient seen in the emergency department on March 5 and had a CT abdomen pelvis that revealed a normal appendix, with right ovarian cystic lesion.  No signs of ovarian torsion on ultrasound imaging Patient was discharged, and follow-up with her gynecologist.  She had a pelvic exam performed and a repeat ultrasound and they did not feel that the ovarian cyst is causing her pain They gave her pain medications, and told her if it gets worse to go to ER. Patient reports the pain is worsened and she is unable to control it at home. She also reports at times the pain radiates from her abdomen into her chest Husband reports that every time they hit a bump on the way here she had worsened pain Patient with history of RA, is on Enbrel and Plaquenil Home Medications Prior to Admission medications   Medication Sig Start Date End Date Taking? Authorizing Provider  acetaminophen (TYLENOL) 500 MG tablet Take 1,000 mg by mouth every 6 (six) hours as needed for mild pain or headache.    [provider]  albuterol (PROVENTIL HFA;VENTOLIN HFA) 108 (90 Base) MCG/ACT inhaler Inhale 1-2 puffs into the lungs every 6 (six) hours as needed for wheezing or shortness of breath.     [provider]  norethindrone (MICRONOR,CAMILA,ERRIN) 0.35 MG tablet Take 1 tablet by mouth at bedtime.  01/23/18   [provider]  ondansetron (ZOFRAN-ODT) 4 MG disintegrating tablet Take 1 tablet (4 mg total) by mouth every 8 (eight) hours as needed for nausea or vomiting. 11/04/22   Tegeler, Gwenyth Allegra, MD  oxyCODONE-acetaminophen (PERCOCET/ROXICET) 5-325 MG tablet Take 1 tablet by mouth every 4 (four) hours as needed for severe pain. 11/04/22   Tegeler, Gwenyth Allegra, MD      Allergies    Dicyclomine hcl    Review of Systems   Review of Systems  Constitutional:  Negative for fever.  Gastrointestinal:  Positive for abdominal pain. Negative for blood in stool.  Genitourinary:  Negative for vaginal bleeding and vaginal discharge.    Physical Exam Updated Vital Signs BP 119/78   Pulse 71   Temp 97.8 F (36.6 C) (Oral)   Resp 13   Ht 1.702 m ('5\' 7"'$ )   Wt 93.3 kg   SpO2 100%   BMI 32.22 kg/m  Physical Exam CONSTITUTIONAL: Well developed/well nourished, anxious HEAD: Normocephalic/atraumatic EYES: EOMI/PERRL, no icterus ENMT: Mucous membranes moist NECK: supple no meningeal signs SPINE/BACK:entire spine nontender CV: S1/S2 noted, no murmurs/rubs/gallops noted LUNGS: Lungs are clear to auscultation bilaterally, no apparent distress ABDOMEN: soft, moderate RLQ tenderness, no rebound or guarding, bowel sounds noted throughout abdomen GU:no cva tenderness NEURO: Pt is awake/alert/appropriate, moves all extremitiesx4.  No  facial droop.   EXTREMITIES: pulses normal/equal, full ROM SKIN: warm, color normal PSYCH: Anxious  ED Results / Procedures / Treatments   Labs (all labs ordered are listed, but only abnormal results are displayed) Labs Reviewed  CBC WITH DIFFERENTIAL/PLATELET - Abnormal; Notable for the following components:      Result Value   MCHC 36.8 (*)    All other components within normal limits  COMPREHENSIVE METABOLIC PANEL - Abnormal; Notable  for the following components:   Glucose, Bld 101 (*)    All other components within normal limits  URINALYSIS, ROUTINE W REFLEX MICROSCOPIC - Abnormal; Notable for the following components:   Specific Gravity, Urine 1.036 (*)    All other components within normal limits  LIPASE, BLOOD  LACTIC ACID, PLASMA  TROPONIN I (HIGH SENSITIVITY)    EKG EKG Interpretation  Date/Time:  Wednesday November 05 2022 23:38:57 EST Ventricular Rate:  81 PR Interval:  123 QRS Duration: 81 QT Interval:  399 QTC Calculation: 464 R Axis:   50 Text Interpretation: Sinus rhythm Low voltage, precordial leads Abnormal R-wave progression, early transition Confirmed by Ripley Fraise [id number] on 11/06/2022 12:09:42 AM  Radiology CT ABDOMEN PELVIS W CONTRAST  Result Date: 11/06/2022 CLINICAL DATA:  Right lower quadrant pain for 2 days, initial encounter EXAM: CT ABDOMEN AND PELVIS WITH CONTRAST TECHNIQUE: Multidetector CT imaging of the abdomen and pelvis was performed using the standard protocol following bolus administration of intravenous contrast. RADIATION DOSE REDUCTION: This exam was performed according to the departmental dose-optimization program which includes automated exposure control, adjustment of the mA and/or kV according to patient size and/or use of iterative reconstruction technique. CONTRAST:  68m OMNIPAQUE IOHEXOL 350 MG/ML SOLN COMPARISON:  11/04/2022 FINDINGS: Lower chest: No acute abnormality. Hepatobiliary: Fatty infiltration of the liver is noted. The gallbladder has been surgically removed. Pancreas: Unremarkable. No pancreatic ductal dilatation or surrounding inflammatory changes. Spleen: Normal in size without focal abnormality. Adrenals/Urinary Tract: Adrenal glands are within normal limits. Kidneys demonstrate a normal enhancement pattern bilaterally. No renal calculi or obstructive changes are noted. The bladder is well distended. Stomach/Bowel: The appendix is well visualized. No obstructive  or inflammatory changes of the colon are seen. Small bowel and stomach are unremarkable. Vascular/Lymphatic: No significant vascular findings are present. No enlarged abdominal or pelvic lymph nodes. Reproductive: Uterus is within normal limits. Right ovarian cyst measuring 3.2 cm is noted similar to that seen on the prior exam. No left adnexal mass is noted. Other: No abdominal wall hernia or abnormality. No abdominopelvic ascites. Musculoskeletal: No acute or significant osseous findings. Postsurgical changes are noted in the lower lumbar spine. IMPRESSION: Stable 3.2 cm right ovarian cyst similar to that seen on the prior exam. No follow-up imaging recommended. Note: This recommendation does not apply to premenarchal patients and to those with increased risk (genetic, family history, elevated tumor markers or other high-risk factors) of ovarian cancer. Reference: JACR 2020 Feb; 17(2):248-254 Fatty liver Electronically Signed   By: MInez CatalinaM.D.   On: 11/06/2022 02:31   [place]PELVIC COMPLETE W TRANSVAGINAL AND TORSION R/O  Result Date: 11/04/2022 CLINICAL DATA:  Ovarian cyst EXAM: TRANSABDOMINAL AND TRANSVAGINAL ULTRASOUND OF PELVIS DOPPLER ULTRASOUND OF OVARIES TECHNIQUE: Both transabdominal and transvaginal ultrasound examinations of the pelvis were performed. Transabdominal technique was performed for global imaging of the pelvis including uterus, ovaries, adnexal regions, and pelvic cul-de-sac. It was necessary to proceed with endovaginal exam following the transabdominal exam to visualize the ovaries/adnexa. Color and duplex Doppler ultrasound was  utilized to evaluate blood flow to the ovaries. COMPARISON:  Same day CT, ultrasound 01/25/2018 FINDINGS: Uterus Measurements: 9.8 x 4.0 x 4.7 cm = volume: 97.8 mL. No fibroids or other mass visualized. Endometrium Thickness: 10.6 mm, normal for age. No focal abnormality visualized. Right ovary Measurements: 3.7 x 3.3 x 3.1 cm = volume: 20.3 mL. There is a  dominant follicle measuring 2.7 x 2.2 x 2.8 cm. Left ovary Measurements: 3.7 x 2.0 x 2.8 cm = volume: 10.9 mL. Normal appearance/no adnexal mass. Pulsed Doppler evaluation of both ovaries demonstrates normal low-resistance arterial and venous waveforms. Other findings No abnormal free fluid. IMPRESSION: Dominant follicle in the right ovary measuring up to 2.8 cm. No evidence of ovarian torsion. Electronically Signed   By: Maurine Simmering M.D.   On: 11/04/2022 14:27   CT ABDOMEN PELVIS W CONTRAST  Result Date: 11/04/2022 CLINICAL DATA:  Right lower quadrant pain prior cholecystectomy and lumbar surgery. EXAM: CT ABDOMEN AND PELVIS WITH CONTRAST TECHNIQUE: Multidetector CT imaging of the abdomen and pelvis was performed using the standard protocol following bolus administration of intravenous contrast. RADIATION DOSE REDUCTION: This exam was performed according to the departmental dose-optimization program which includes automated exposure control, adjustment of the mA and/or kV according to patient size and/or use of iterative reconstruction technique. CONTRAST:  16m OMNIPAQUE IOHEXOL 300 MG/ML  SOLN COMPARISON:  CT 01/25/2018.  Ultrasound 12/24/2021 FINDINGS: Lower chest: Breathing motion along the lung bases. No pleural effusion. Hepatobiliary: Mild fatty liver infiltration. No space-occupying liver lesion. Patent portal vein. Gallbladder is absent as per history. Pancreas: Unremarkable. No pancreatic ductal dilatation or surrounding inflammatory changes. Spleen: Normal in size without focal abnormality. Adrenals/Urinary Tract: The adrenal glands are preserved. No enhancing renal mass or collecting system dilatation. The ureters have normal course and caliber dome of the bladder. Preserved contours of the urinary bladder. Stomach/Bowel: Moderate colonic stool on the right side. The large bowel is nondilated. Normal retrocecal appendix. Stomach and small bowel are nondilated. Vascular/Lymphatic: Normal caliber aorta  and IVC. No specific abnormal lymph node enlargement identified in the abdomen and pelvis. Small nodes in the upper retroperitoneum are not pathologic by size criteria. Reproductive: Uterus is present. There is cystic lesion in the right ovary measuring 2.8 by 2.1 cm this was not seen on the prior. Other: No free fluid or free air. Musculoskeletal: Streak artifact related to the fixation hardware along the lumbosacral spine at L5-S1. IMPRESSION: No bowel obstruction, free air or free fluid.  Normal appendix. 2.8 cm right ovarian cystic lesion, new from the study of 2019. No follow-up imaging recommended in the absence of symptoms. If patient is particularly symptomatic and ultrasound could be performed to further assess the right ovary. Note: This recommendation does not apply to premenarchal patients and to those with increased risk (genetic, family history, elevated tumor markers or other high-risk factors) of ovarian cancer. Reference: JACR 2020 Feb; 17(2):248-254 Electronically Signed   By: AJill SideM.D.   On: 11/04/2022 12:27    Procedures Procedures    Medications Ordered in ED Medications  ondansetron (ZOFRAN) injection 4 mg (4 mg Intravenous Not Given 11/06/22 0008)  HYDROmorphone (DILAUDID) injection 0.5 mg (has no administration in time range)  ondansetron (ZOFRAN) injection 4 mg (has no administration in time range)  sodium chloride 0.9 % bolus 1,000 mL (0 mLs Intravenous Stopped 11/06/22 0120)  fentaNYL (SUBLIMAZE) injection 100 mcg (100 mcg Intravenous Given 11/05/22 2354)  ondansetron (ZOFRAN) injection 4 mg (4 mg Intravenous Given 11/05/22 2352)  fentaNYL (SUBLIMAZE) injection 100 mcg (100 mcg Intravenous Given 11/06/22 0116)  iohexol (OMNIPAQUE) 350 MG/ML injection 75 mL (75 mLs Intravenous Contrast Given 11/06/22 0222)  ketorolac (TORADOL) 15 MG/ML injection 15 mg (15 mg Intravenous Given 11/06/22 0255)    ED Course/ Medical Decision Making/ A&P Clinical Course as of 11/06/22 0319  Thu  Nov 06, 2022  0017 Patient with recent evaluation of abdominal pain now with worsening pain in the right lower quadrant.  She appears uncomfortable.  Patient may need repeat CT imaging [DW]  0317 Extensive repeat evaluation is unremarkable.  Her labs were unremarkable, no signs of lactic acidosis.  Low suspicion for ischemic bowel at this time.  No signs of appendicitis on second CT scan.  No acute changes noted in the cystic lesion on CT imaging.  Patient still reports pain.  Fortunately her vital signs are appropriate.  She is not actively vomiting.  Patient reports she has follow-up with her gynecologist in about 24 hours.  She already has pain medicine and antiemetics at home.  At this point she is safe for discharge home with continued management of her pain and evaluation of her pain as an outpatient.  At this point multiple extensive evaluations of ruled out acute abdominal or gynecologic emergency.  She has already received a pelvic exam and ultrasound by OB/GYN yesterday. [DW]    Clinical Course User Index [DW] Ripley Fraise, MD                             Medical Decision Making Amount and/or Complexity of Data Reviewed Labs: ordered. Radiology: ordered. ECG/medicine tests: ordered.  Risk Prescription drug management.   This patient presents to the ED for concern of abdominal pain, this involves an extensive number of treatment options, and is a complaint that carries with it a high risk of complications and morbidity.  The differential diagnosis includes but is not limited to cholecystitis, cholelithiasis, pancreatitis, gastritis, peptic ulcer disease, appendicitis, bowel obstruction, bowel perforation, diverticulitis, AAA, ischemic bowel, ACS, ectopic pregnancy, PID, TOA, torsion    Comorbidities that complicate the patient evaluation: Patient's presentation is complicated by their history of rheumatoid arthritis   Additional history obtained: Additional history obtained  from spouse Records reviewed previous admission documents  Lab Tests: I Ordered, and personally interpreted labs.  The pertinent results include: Labs unremarkable  Imaging Studies ordered: I ordered imaging studies including CT scan abdomen pelvis   I independently visualized and interpreted imaging which showed no acute findings I agree with the radiologist interpretation  Cardiac Monitoring: The patient was maintained on a cardiac monitor.  I personally viewed and interpreted the cardiac monitor which showed an underlying rhythm of:  sinus rhythm  Medicines ordered and prescription drug management: I ordered medication including fentanyl for pain Reevaluation of the patient after these medicines showed that the patient    stayed the same   Reevaluation: After the interventions noted above, I reevaluated the patient and found that they have :stayed the same  Complexity of problems addressed: Patient's presentation is most consistent with  acute presentation with potential threat to life or bodily function  Disposition: After consideration of the diagnostic results and the patient's response to treatment,  I feel that the patent would benefit from discharge   .           Final Clinical Impression(s) / ED Diagnoses Final diagnoses:  Right lower quadrant abdominal pain    Rx /  DC Orders ED Discharge Orders     None         Ripley Fraise, MD 11/06/22 [phone]

## 2022-11-06 NOTE — Discharge Instructions (Signed)
  SEEK IMMEDIATE MEDICAL ATTENTION IF: A temperature above 100.31F develops.  Repeated vomiting occurs (multiple episodes).  The pain becomes localized to portions of the abdomen.  In an adult, the left lower portion of the abdomen could be colitis or diverticulitis.  Blood is being passed in stools or vomit (bright red or black tarry stools).  Return also if you develop chest pain, difficulty breathing, dizziness or fainting, or become confused, poorly responsive, or inconsolable.

## 2023-09-07 ENCOUNTER — Ambulatory Visit
Admission: EM | Admit: 2023-09-07 | Discharge: 2023-09-07 | Disposition: A | Discharge status: Home / Self Care | Payer: 59

## 2023-09-07 DIAGNOSIS — J069 Acute upper respiratory infection, unspecified: Secondary | ICD-10-CM

## 2023-09-07 DIAGNOSIS — J45901 Unspecified asthma with (acute) exacerbation: Secondary | ICD-10-CM | POA: Diagnosis not present

## 2023-09-07 HISTORY — DX: Unspecified asthma, uncomplicated: J45.909

## 2023-09-07 MED ORDER — ALBUTEROL SULFATE HFA 108 (90 BASE) MCG/ACT IN AERS: 1.0000 | INHALATION_SPRAY | Freq: Four times a day (QID) | RESPIRATORY_TRACT | 0 refills | Status: AC | PRN

## 2023-09-07 MED ORDER — PREDNISONE 10 MG PO TABS: 40.0000 mg | ORAL_TABLET | Freq: Every day | ORAL | 0 refills | Status: AC

## 2023-09-07 MED ORDER — AZITHROMYCIN 250 MG PO TABS: 250.0000 mg | ORAL_TABLET | Freq: Every day | ORAL | 0 refills | Status: AC

## 2023-09-07 NOTE — Discharge Instructions (Addendum)
 Take the Zithromax and prednisone as directed.  Use the albuterol inhaler as directed.  Follow up with your primary care provider.  Go to the emergency department if you have worsening symptoms.

## 2023-09-07 NOTE — ED Triage Notes (Signed)
 Patient to Urgent Care with complaints of nasal congestion/ body aches/ fatigue/ cough/ chest congestion. Denies any known fevers.   Symptoms started Thursday.   Taking Mucinex severe cold.

## 2023-09-07 NOTE — ED Provider Notes (Signed)
 [name]    CSN: [id number] Arrival date & time: 09/07/23  1531      History   Chief Complaint Chief Complaint  Patient presents with   Nasal Congestion    HPI Maureen Nelson is a 38 y.o. female.  Patient presents with 4 to 5-day history of congestion, cough, wheezing, shortness of breath, body aches, fatigue.  No fever, vomiting, diarrhea.  She has been treating her symptoms with Mucinex.  She reports medical history includes asthma but she does not currently have an albuterol  inhaler.  The history is provided by the patient and medical records.    Past Medical History:  Diagnosis Date   Asthma    Complication of anesthesia    Difficulty waking up   Degenerative disc disease, lumbar    Degenerative disc disease, lumbar    Headache     Patient Active Problem List   Diagnosis Date Noted   Distal Enteritis 01/25/2018   Leucocytosis 01/25/2018   Indication for care in labor or delivery 04/06/2016   Vaginal delivery 04/06/2016    Past Surgical History:  Procedure Laterality Date   BACK SURGERY     BREAST LUMPECTOMY     BREAST LUMPECTOMY     CHOLECYSTECTOMY     SPINAL FUSION      OB History     Gravida  2   Para  2   Term  2   Preterm      AB      Living  2      SAB      IAB      Ectopic      Multiple  0   Live Births  2            Home Medications    Prior to Admission medications   Medication Sig Start Date End Date Taking? Authorizing Provider  albuterol  (VENTOLIN  HFA) 108 (90 Base) MCG/ACT inhaler Inhale 1-2 puffs into the lungs every 6 (six) hours as needed. 09/07/23  Yes Corlis Burnard DEL, NP  azithromycin  (ZITHROMAX ) 250 MG tablet Take 1 tablet (250 mg total) by mouth daily. Take first 2 tablets together, then 1 every day until finished. 09/07/23  Yes Corlis Burnard DEL, NP  predniSONE  (DELTASONE ) 10 MG tablet Take 4 tablets (40 mg total) by mouth daily for 5 days. 09/07/23 09/12/23 Yes Corlis Burnard DEL, NP  acetaminophen  (TYLENOL )  500 MG tablet Take 1,000 mg by mouth every 6 (six) hours as needed for mild pain or headache.    [provider]  ENBREL SURECLICK 50 MG/ML injection Inject into the skin.    [provider]  hydroxychloroquine (PLAQUENIL) 200 MG tablet Take 200 mg by mouth 2 (two) times daily.    [provider]  meloxicam (MOBIC) 15 MG tablet Take 15 mg by mouth daily.    [provider]  norethindrone  (MICRONOR ,CAMILA ,ERRIN ) 0.35 MG tablet Take 1 tablet by mouth at bedtime.  Patient not taking: Reported on 09/07/2023 01/23/18   [provider]  ondansetron  (ZOFRAN -ODT) 4 MG disintegrating tablet Take 1 tablet (4 mg total) by mouth every 8 (eight) hours as needed for nausea or vomiting. 11/04/22   Tegeler, Lonni PARAS, MD  oxyCODONE -acetaminophen  (PERCOCET/ROXICET) 5-325 MG tablet Take 1 tablet by mouth every 4 (four) hours as needed for severe pain. Patient not taking: Reported on 09/07/2023 11/04/22   Tegeler, Lonni PARAS, MD    Family History Family History  Problem Relation Age of Onset  Heart disease Maternal Grandmother    Cancer Maternal Grandfather    Cancer Paternal Grandfather     Social History Social History   Tobacco Use   Smoking status: Never   Smokeless tobacco: Never  Vaping Use   Vaping status: Never Used  Substance Use Topics   Alcohol use: No    Comment: Occasional   Drug use: No     Allergies   Dicyclomine hcl   Review of Systems Review of Systems  Constitutional:  Negative for chills and fever.  HENT:  Positive for congestion. Negative for ear pain and sore throat.   Respiratory:  Positive for cough, shortness of breath and wheezing.      Physical Exam Triage Vital Signs ED Triage Vitals  Encounter Vitals Group     BP 09/07/23 1550 132/86     Systolic BP Percentile --      Diastolic BP Percentile --      Pulse Rate 09/07/23 1544 (!) 101     Resp 09/07/23 1544 18     Temp 09/07/23 1544 98 F (36.7 C)     Temp src  --      SpO2 09/07/23 1544 98 %     Weight --      Height --      Head Circumference --      Peak Flow --      Pain Score 09/07/23 1547 3     Pain Loc --      Pain Education --      Exclude from Growth Chart --    No data found.  Updated Vital Signs BP 132/86   Pulse (!) 101   Temp 98 F (36.7 C)   Resp 18   LMP 08/29/2023 (Exact Date)   SpO2 98%   Breastfeeding No   Visual Acuity Right Eye Distance:   Left Eye Distance:   Bilateral Distance:    Right Eye Near:   Left Eye Near:    Bilateral Near:     Physical Exam Constitutional:      General: She is not in acute distress. HENT:     Right Ear: Tympanic membrane normal.     Left Ear: Tympanic membrane normal.     Nose: Congestion present.     Mouth/Throat:     Mouth: Mucous membranes are moist.     Pharynx: Oropharynx is clear.  Cardiovascular:     Rate and Rhythm: Normal rate and regular rhythm.     Heart sounds: Normal heart sounds.  Pulmonary:     Effort: Pulmonary effort is normal. No respiratory distress.     Breath sounds: Normal breath sounds. No wheezing.  Neurological:     Mental Status: She is alert.      UC Treatments / Results  Labs (all labs ordered are listed, but only abnormal results are displayed) Labs Reviewed - No data to display  EKG   Radiology No results found.  Procedures Procedures (including critical care time)  Medications Ordered in UC Medications - No data to display  Initial Impression / Assessment and Plan / UC Course  I have reviewed the triage vital signs and the nursing notes.  Pertinent labs & imaging results that were available during my care of the patient were reviewed by me and considered in my medical decision making (see chart for details).    Acute upper respiratory infection, Asthma exacerbation.  O2 sat 98%.  Treating with albuterol  inhaler, prednisone , Zithromax .  Education provided  on upper respiratory infection and asthma.  Instructed patient to  follow-up with her PCP.  ED precautions given.  She agrees to plan of care.  Final Clinical Impressions(s) / UC Diagnoses   Final diagnoses:  Acute upper respiratory infection  Asthma with acute exacerbation, unspecified asthma severity, unspecified whether persistent     Discharge Instructions      Take the Zithromax  and prednisone  as directed.  Use the albuterol  inhaler as directed.  Follow up with your primary care provider.  Go to the emergency department if you have worsening symptoms.        ED Prescriptions     Medication Sig Dispense Auth. Provider   albuterol  (VENTOLIN  HFA) 108 (90 Base) MCG/ACT inhaler Inhale 1-2 puffs into the lungs every 6 (six) hours as needed. 18 g Corlis Burnard DEL, NP   predniSONE  (DELTASONE ) 10 MG tablet Take 4 tablets (40 mg total) by mouth daily for 5 days. 20 tablet Corlis Burnard DEL, NP   azithromycin  (ZITHROMAX ) 250 MG tablet Take 1 tablet (250 mg total) by mouth daily. Take first 2 tablets together, then 1 every day until finished. 6 tablet Corlis Burnard DEL, NP      PDMP not reviewed this encounter.   Corlis Burnard DEL, NP 09/07/23 [phone]

## 2023-11-19 IMAGING — US US ABDOMEN COMPLETE
1 series · 14 of 25 positions shown · non-contrast
Comparison: None.

CLINICAL DATA: Elevated LFTs

EXAM:
ABDOMEN ULTRASOUND COMPLETE

[Series 1: us abdomen complete · 0.25mm/px · 14 of 72 slices shown]
[im 1/72]
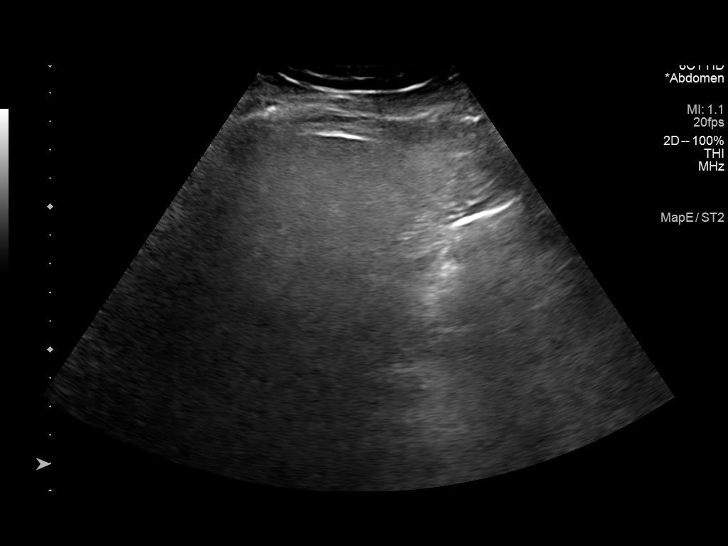
[im 6/72]
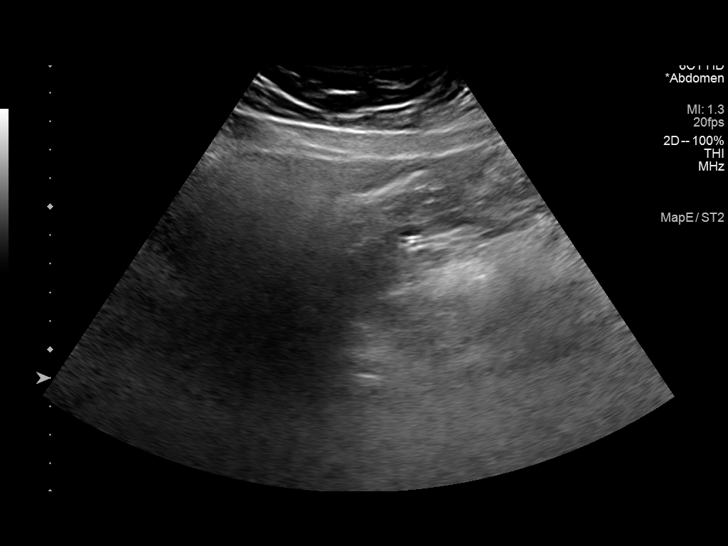
[im 12/72]
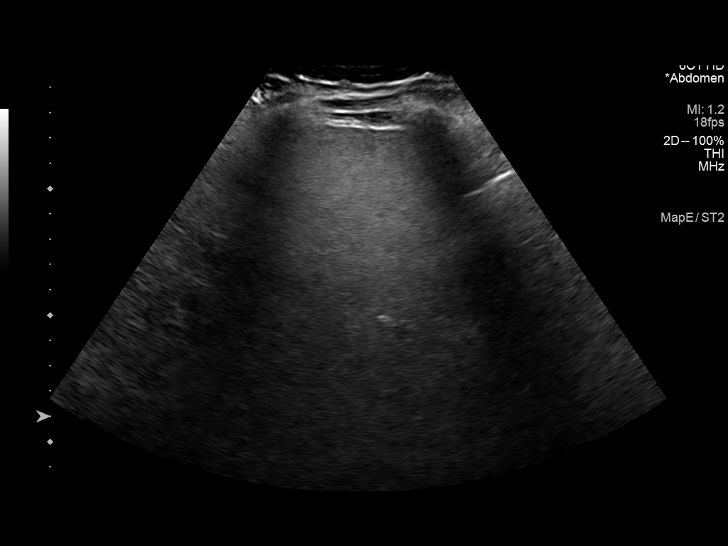
[im 18/72]
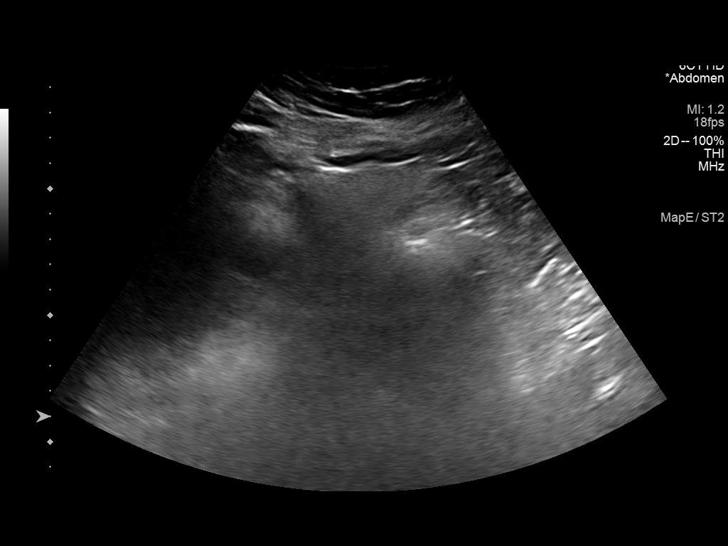
[im 24/72]
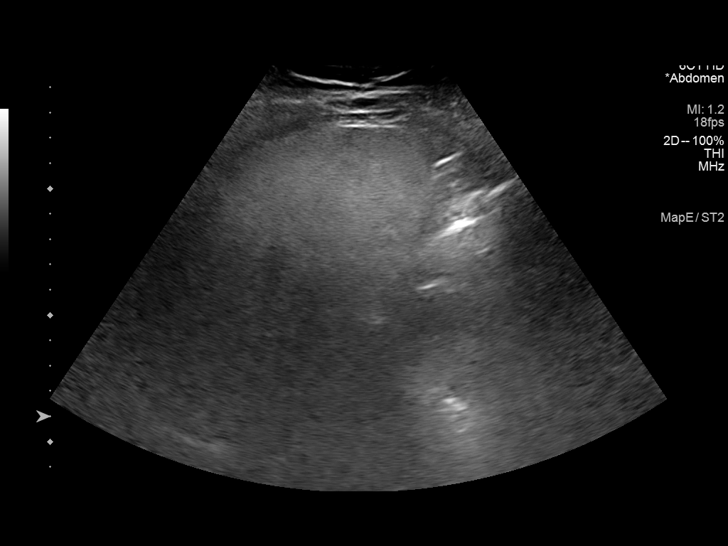
[im 27/72]
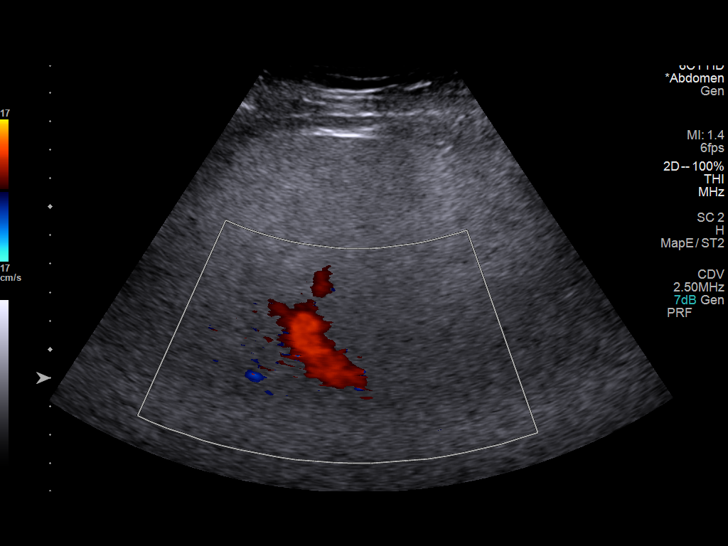
[im 33/72]
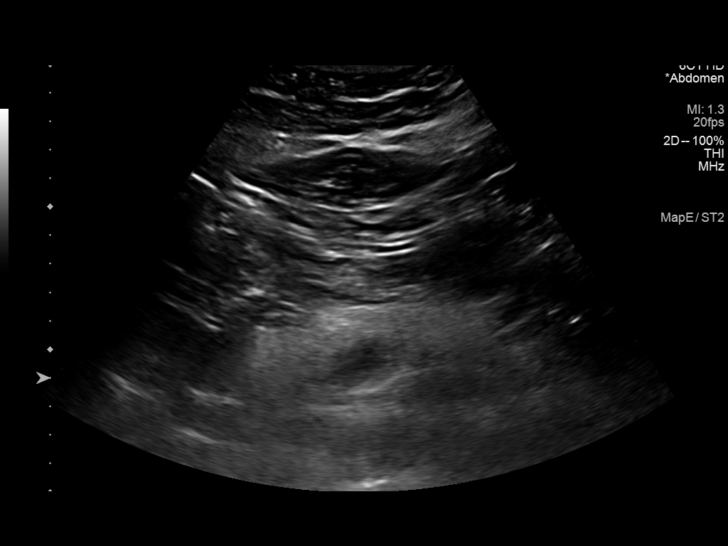
[im 39/72]
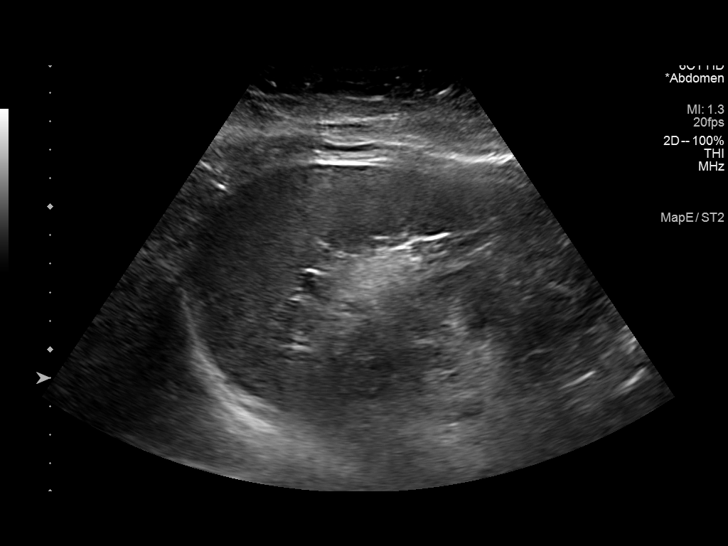
[im 45/72]
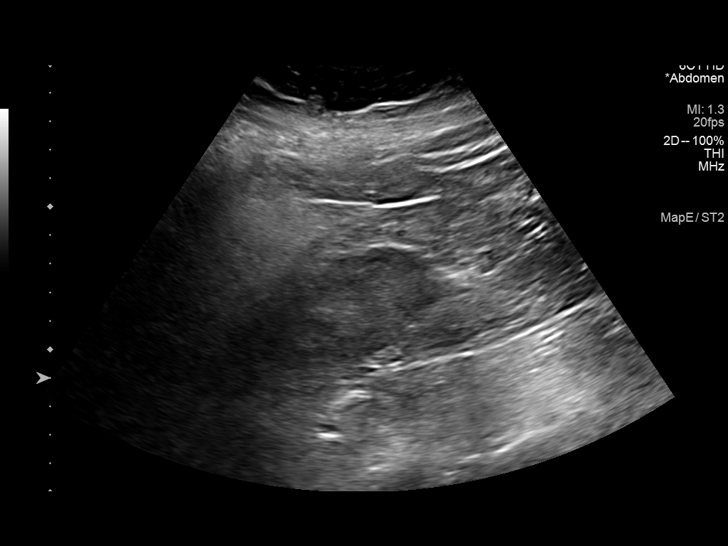
[im 48/72]
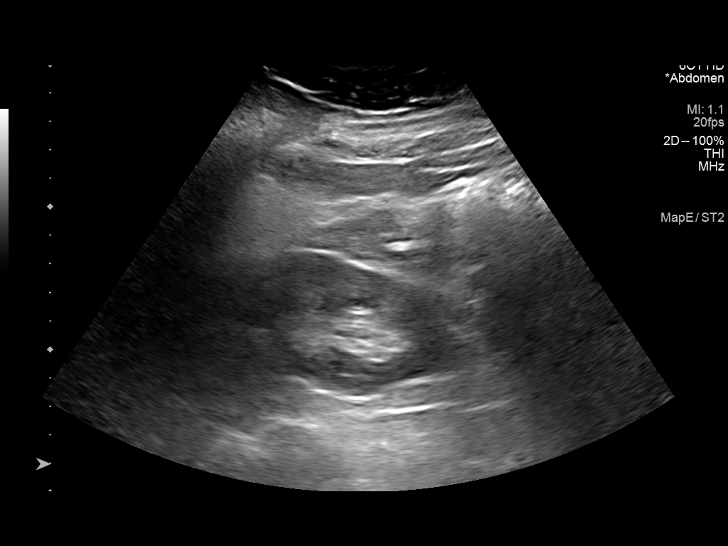
[im 54/72]
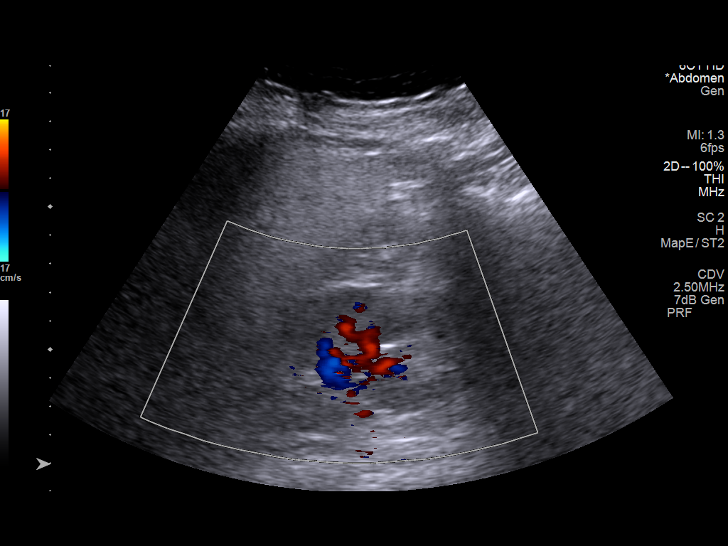
[im 60/72]
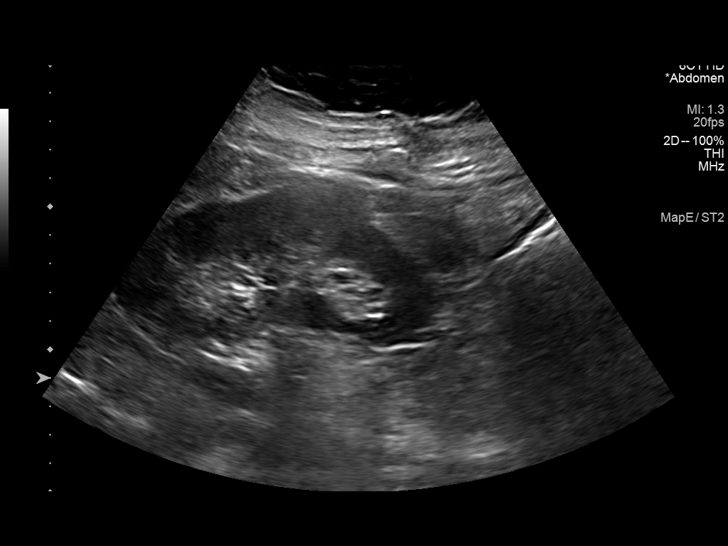
[im 66/72]
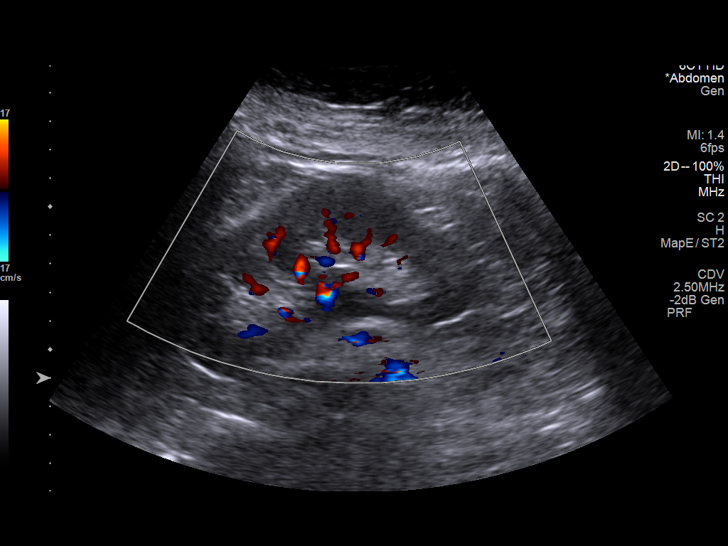
[im 72/72]
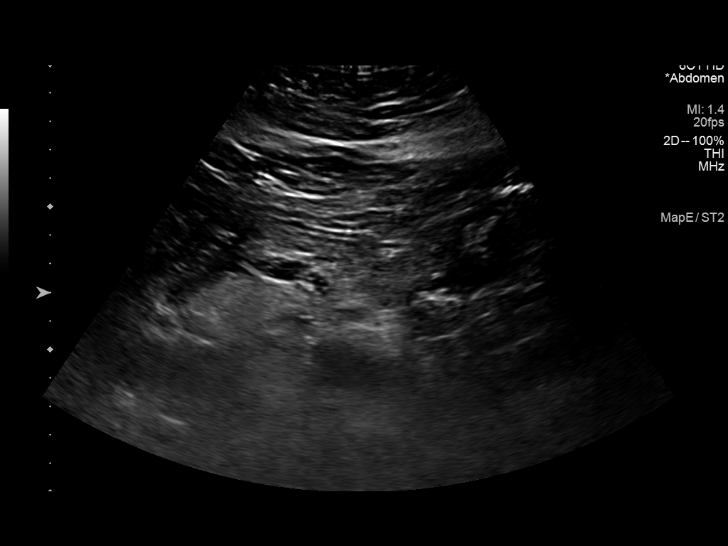

[14 of 25 positions shown; findings below may reference images not displayed]

FINDINGS: Gallbladder: Surgically absent

Common bile duct: Diameter: 4 mm

Liver: Inhomogeneous diffusely increased echogenicity of the
parenchyma. No focal mass identified. Portal vein is patent on color
Doppler imaging with normal direction of blood flow towards the
liver.

IVC: No abnormality visualized.

Pancreas: Visualized portion unremarkable.

Spleen: Size and appearance within normal limits.

Right Kidney: Length: 11.1 cm. Echogenicity within normal limits. No
mass or hydronephrosis visualized.

Left Kidney: Length: 12 cm. Echogenicity within normal limits. No
mass or hydronephrosis visualized.

Abdominal aorta: No aneurysm visualized.

Other findings: None.
IMPRESSION: Diffuse inhomogeneous increased echogenicity of the liver parenchyma
suggesting hepatic steatosis and/or other hepatocellular disease.

## 2023-12-28 ENCOUNTER — Other Ambulatory Visit (HOSPITAL_BASED_OUTPATIENT_CLINIC_OR_DEPARTMENT_OTHER): Payer: Self-pay

## 2023-12-28 ENCOUNTER — Emergency Department (HOSPITAL_BASED_OUTPATIENT_CLINIC_OR_DEPARTMENT_OTHER)

## 2023-12-28 ENCOUNTER — Emergency Department (HOSPITAL_BASED_OUTPATIENT_CLINIC_OR_DEPARTMENT_OTHER): Admitting: Radiology

## 2023-12-28 ENCOUNTER — Other Ambulatory Visit: Payer: Self-pay

## 2023-12-28 ENCOUNTER — Encounter (HOSPITAL_BASED_OUTPATIENT_CLINIC_OR_DEPARTMENT_OTHER): Payer: Self-pay | Admitting: Emergency Medicine

## 2023-12-28 ENCOUNTER — Emergency Department (HOSPITAL_BASED_OUTPATIENT_CLINIC_OR_DEPARTMENT_OTHER)
Admission: EM | Admit: 2023-12-28 | Discharge: 2023-12-28 | Disposition: A | Discharge status: Home / Self Care | Attending: Emergency Medicine | Admitting: Emergency Medicine

## 2023-12-28 DIAGNOSIS — Z8739 Personal history of other diseases of the musculoskeletal system and connective tissue: Secondary | ICD-10-CM | POA: Insufficient documentation

## 2023-12-28 DIAGNOSIS — M5441 Lumbago with sciatica, right side: Secondary | ICD-10-CM | POA: Insufficient documentation

## 2023-12-28 DIAGNOSIS — M5442 Lumbago with sciatica, left side: Secondary | ICD-10-CM | POA: Diagnosis not present

## 2023-12-28 DIAGNOSIS — M545 Low back pain, unspecified: Secondary | ICD-10-CM | POA: Diagnosis present

## 2023-12-28 MED ORDER — METHOCARBAMOL 750 MG PO TABS
750.0000 mg | ORAL_TABLET | Freq: Three times a day (TID) | ORAL | 0 refills | Status: AC | PRN
  Filled 2023-12-28: qty 15, 5d supply, fill #0

## 2023-12-28 MED ORDER — HYDROMORPHONE HCL 1 MG/ML IJ SOLN
1.0000 mg | Freq: Once | INTRAMUSCULAR | Status: AC
  Administered 2023-12-28: 1 mg via INTRAMUSCULAR
  Filled 2023-12-28: qty 1

## 2023-12-28 MED ORDER — TRAMADOL HCL 50 MG PO TABS
50.0000 mg | ORAL_TABLET | Freq: Four times a day (QID) | ORAL | 0 refills | Status: AC | PRN
  Filled 2023-12-28: qty 15, 4d supply, fill #0

## 2023-12-28 MED ORDER — DEXAMETHASONE SODIUM PHOSPHATE 10 MG/ML IJ SOLN
10.0000 mg | Freq: Once | INTRAMUSCULAR | Status: AC
  Administered 2023-12-28: 10 mg via INTRAMUSCULAR
  Filled 2023-12-28: qty 1

## 2023-12-28 MED ORDER — PREDNISONE 20 MG PO TABS
ORAL_TABLET | ORAL | 0 refills | Status: AC
  Filled 2023-12-28: qty 15, 8d supply, fill #0

## 2023-12-28 NOTE — Discharge Instructions (Signed)
 It was our pleasure to provide your ER care today - we hope that you feel better.  Avoid bending at waist or heavy lifting > 20 lbs for the next week.  Try gentle massage and/or heat  therapy to sore area. Take prednisone  as prescribed. Take acetaminophen  as need for pain. You may also take  ultram as need for pain, or robaxin as need for muscle pain/spasm - no driving for the next 6 hours, of if/when taking these meds.   Follow up with back specialist in the next 1-2 weeks if symptoms fail to improve/resolve - call office to arrange appointment.   Return to ER if worse, new symptoms, fevers, severe/intractable pain, numbness/weakness, problems with bowel or bladder function, or other concern.

## 2023-12-28 NOTE — ED Notes (Signed)
 Discharge paperwork given and verbally understood.

## 2023-12-28 NOTE — ED Provider Notes (Signed)
 [place] EMERGENCY DEPARTMENT AT [facility] Provider Note   CSN: [id number] Arrival date & time: 12/28/23  7829     History  Chief Complaint  Patient presents with   Back Pain   Leg Pain    Maureen Nelson is a 38 y.o. female.  Pt with hx ddd, remote hx lumbar fusion surgery, c/o 'throwing out my back' yesterday. Indicates was in process of sitting on toilet, ?awkward position, felt pain to lower back. Dull back to lower back since, occasionally radiating down posterior/lat legs with certain movements or positional changes. Felt fine immediately prior to the bending episode/onset of pain. No other recent injury. No saddle area or leg numbness. No weakness. No problems with normal bowel and bladder function. No fever or chills. No gu c/o.   The history is provided by the patient and medical records.  Back Pain Associated symptoms: leg pain   Associated symptoms: no abdominal pain, no chest pain, no dysuria, no fever, no headaches, no numbness and no weakness   Leg Pain Associated symptoms: back pain   Associated symptoms: no fever and no neck pain        Home Medications Prior to Admission medications   Medication Sig Start Date End Date Taking? Authorizing Provider  methocarbamol (ROBAXIN) 750 MG tablet Take 1 tablet (750 mg total) by mouth 3 (three) times daily as needed (muscle spasm/pain). 12/28/23  Yes Guadalupe Lee, MD  predniSONE  (DELTASONE ) 20 MG tablet 3 po once a day for 2 days, then 2 po once a day for 3 days, then 1 po once a day for 3 days 12/29/23  Yes Kashia Brossard, MD  traMADol (ULTRAM) 50 MG tablet Take 1 tablet (50 mg total) by mouth every 6 (six) hours as needed. 12/28/23  Yes Guadalupe Lee, MD  acetaminophen  (TYLENOL ) 500 MG tablet Take 1,000 mg by mouth every 6 (six) hours as needed for mild pain or headache.    [provider]  albuterol  (VENTOLIN  HFA) 108 (90 Base) MCG/ACT inhaler Inhale 1-2 puffs into the lungs every 6 (six) hours as  needed. 09/07/23   Wellington Half, NP  azithromycin  (ZITHROMAX ) 250 MG tablet Take 1 tablet (250 mg total) by mouth daily. Take first 2 tablets together, then 1 every day until finished. 09/07/23   Wellington Half, NP  ENBREL SURECLICK 50 MG/ML injection Inject into the skin.    [provider]  hydroxychloroquine (PLAQUENIL) 200 MG tablet Take 200 mg by mouth 2 (two) times daily.    [provider]  meloxicam (MOBIC) 15 MG tablet Take 15 mg by mouth daily.    [provider]  norethindrone  (MICRONOR ,CAMILA ,ERRIN ) 0.35 MG tablet Take 1 tablet by mouth at bedtime.  Patient not taking: Reported on 09/07/2023 01/23/18   [provider]  ondansetron  (ZOFRAN -ODT) 4 MG disintegrating tablet Take 1 tablet (4 mg total) by mouth every 8 (eight) hours as needed for nausea or vomiting. 11/04/22   Tegeler, Marine Sia, MD  oxyCODONE -acetaminophen  (PERCOCET/ROXICET) 5-325 MG tablet Take 1 tablet by mouth every 4 (four) hours as needed for severe pain. Patient not taking: Reported on 09/07/2023 11/04/22   Tegeler, Marine Sia, MD      Allergies    Dicyclomine hcl    Review of Systems   Review of Systems  Constitutional:  Negative for chills and fever.  Respiratory:  Negative for cough and shortness of breath.   Cardiovascular:  Negative for chest pain.  Gastrointestinal:  Negative for abdominal  pain, nausea and vomiting.  Genitourinary:  Negative for dysuria, flank pain and hematuria.  Musculoskeletal:  Positive for back pain. Negative for neck pain.  Skin:  Negative for rash.  Neurological:  Negative for weakness, numbness and headaches.    Physical Exam Updated Vital Signs BP 100/69   Pulse 85   Temp 97.8 F (36.6 C)   Resp 13   Ht 1.702 m (5\' 7" )   Wt 104.3 kg   LMP 12/14/2023   SpO2 100%   BMI 36.02 kg/m  Physical Exam Vitals and nursing note reviewed.  Constitutional:      Appearance: Normal appearance. She is well-developed.  HENT:     Head: Atraumatic.      Nose: Nose normal.     Mouth/Throat:     Mouth: Mucous membranes are moist.  Eyes:     General: No scleral icterus.    Conjunctiva/sclera: Conjunctivae normal.  Neck:     Trachea: No tracheal deviation.  Cardiovascular:     Rate and Rhythm: Normal rate and regular rhythm.     Pulses: Normal pulses.     Heart sounds: Normal heart sounds. No murmur heard.    No friction rub. No gallop.  Pulmonary:     Effort: Pulmonary effort is normal. No respiratory distress.     Breath sounds: Normal breath sounds.  Abdominal:     General: There is no distension.     Palpations: Abdomen is soft. There is no mass.     Tenderness: There is no abdominal tenderness.  Genitourinary:    Comments: No cva tenderness.  Musculoskeletal:        General: No swelling.     Cervical back: Neck supple. No rigidity. No muscular tenderness.     Comments: Mid to lower lumbar tenderness, otherwise, CTLS spine, non tender, aligned, no step off. Good passive rom bil hips/knees/lower legs without pain. No lower extremity swelling. Bil legs of normal color and warmth with intact distal pulses bil.   Skin:    General: Skin is warm and dry.     Findings: No rash.  Neurological:     Mental Status: She is alert.     Comments: Alert, speech normal. Motor/sens grossly intact bil lower extremities, stre 5/5, sens intact. Reflexes symmetric.   Psychiatric:        Mood and Affect: Mood normal.     ED Results / Procedures / Treatments   Labs (all labs ordered are listed, but only abnormal results are displayed) Labs Reviewed - No data to display  EKG None  Radiology DG Lumbar Spine Complete Result Date: 12/28/2023 CLINICAL DATA:  New onset of low back pain. EXAM: LUMBAR SPINE - COMPLETE 4+ VIEW COMPARISON:  Lumbar spine CT 04/18/2008. Abdominopelvic CT 11/06/2022. FINDINGS: There are 5 lumbar type vertebral bodies. There are stable postsurgical changes from posterior pedicle screw and rod fusion at L5-S1. The  hardware appears intact, without evidence of loosening. The alignment is normal. Stable chronic disc space narrowing at L5-S1. There is also mild disc space narrowing at L4-5. No evidence of acute fracture or pars defect. Grossly stable pelvic phleboliths. IMPRESSION: Stable postsurgical changes at L5-S1. No acute osseous findings or malalignment. Electronically Signed   By: Elmon Hagedorn M.D.   On: 12/28/2023 11:35    Procedures Procedures    Medications Ordered in ED Medications  HYDROmorphone  (DILAUDID ) injection 1 mg (1 mg Intramuscular Given 12/28/23 1023)  dexamethasone  (DECADRON ) injection 10 mg (10 mg Intramuscular Given 12/28/23 1022)  ED Course/ Medical Decision Making/ A&P                                 Medical Decision Making Problems Addressed: Acute bilateral low back pain with bilateral sciatica: acute illness or injury with systemic symptoms that poses a threat to life or bodily functions History of degenerative disc disease: chronic illness or injury that poses a threat to life or bodily functions  Amount and/or Complexity of Data Reviewed External Data Reviewed: notes. Radiology: ordered and independent interpretation performed. Decision-making details documented in ED Course.  Risk Prescription drug management. Parenteral controlled substances.   Imaging.   Reviewed nursing notes and prior charts for additional history.   Tried otc meds without relief. Has ride, does not have to drive.   Dilaudid  IM. Decadron  IM.  Xrays reviewed/interpreted by me - no fx. Hardware intact.     Pain improved.   Pt appears stable for d/c.   Rec back specialty f/u. Return precautions provided.         Final Clinical Impression(s) / ED Diagnoses Final diagnoses:  Acute bilateral low back pain with bilateral sciatica  History of degenerative disc disease    Rx / DC Orders ED Discharge Orders          Ordered    predniSONE  (DELTASONE ) 20 MG tablet         12/28/23 1147    traMADol (ULTRAM) 50 MG tablet  Every 6 hours PRN        12/28/23 1147    methocarbamol (ROBAXIN) 750 MG tablet  3 times daily PRN        12/28/23 1147              Guadalupe Lee, MD 12/28/23 1149

## 2024-02-21 ENCOUNTER — Ambulatory Visit
Admission: EM | Admit: 2024-02-21 | Discharge: 2024-02-21 | Disposition: A | Discharge status: Home / Self Care | Attending: Emergency Medicine | Admitting: Emergency Medicine

## 2024-02-21 ENCOUNTER — Encounter: Payer: Self-pay | Admitting: Emergency Medicine

## 2024-02-21 DIAGNOSIS — L237 Allergic contact dermatitis due to plants, except food: Secondary | ICD-10-CM | POA: Diagnosis not present

## 2024-02-21 MED ORDER — METHYLPREDNISOLONE ACETATE 80 MG/ML IJ SUSP
60.0000 mg | Freq: Once | INTRAMUSCULAR | Status: AC
  Administered 2024-02-21: 60 mg via INTRAMUSCULAR

## 2024-02-21 NOTE — Discharge Instructions (Signed)
Today you are being treated for the poison ivy/oak rash  You have been given an injection of steroids today in the office today to help reduce the inflammatory process that occurs with this rash which will help minimize your itching as well as begin to clear  Starting tomorrow take prednisone every morning with food as directed, to continue the above process  You may continue use of topical calamine or Benadryl cream to help manage itching, you may also continue oral Benadryl  Please avoid long exposures to heat such as a hot steamy shower or being outside as this may cause further irritation to your rash  You may follow-up with his urgent care as needed if symptoms persist or worsen 

## 2024-02-21 NOTE — ED Provider Notes (Signed)
 Maureen Nelson    CSN: [id number] Arrival date & time: 02/21/24  9097      History   Chief Complaint Chief Complaint  Patient presents with   Rash    HPI Maureen Nelson is a 38 y.o. female.   Patient presents for evaluation of erythematous pruritic rash present to the face 2 days ago.  Possible exposure to poison ivy/oak.  Rash is worsening and spread over the eyes but denies visual change, drainage or fever.  Completed e-visit but was unable to get medicine as pharmacy was closed.  Past Medical History:  Diagnosis Date   Asthma    Complication of anesthesia    Difficulty waking up   Degenerative disc disease, lumbar    Degenerative disc disease, lumbar    Headache     Patient Active Problem List   Diagnosis Date Noted   Distal Enteritis 01/25/2018   Leucocytosis 01/25/2018   Indication for care in labor or delivery 04/06/2016   Vaginal delivery 04/06/2016    Past Surgical History:  Procedure Laterality Date   BACK SURGERY     BREAST LUMPECTOMY     BREAST LUMPECTOMY     CHOLECYSTECTOMY     SPINAL FUSION      OB History     Gravida  2   Para  2   Term  2   Preterm      AB      Living  2      SAB      IAB      Ectopic      Multiple  0   Live Births  2            Home Medications    Prior to Admission medications   Medication Sig Start Date End Date Taking? Authorizing Provider  acetaminophen  (TYLENOL ) 500 MG tablet Take 1,000 mg by mouth every 6 (six) hours as needed for mild pain or headache.    [provider]  albuterol  (VENTOLIN  HFA) 108 (90 Base) MCG/ACT inhaler Inhale 1-2 puffs into the lungs every 6 (six) hours as needed. 09/07/23   Corlis Burnard DEL, NP  azithromycin  (ZITHROMAX ) 250 MG tablet Take 1 tablet (250 mg total) by mouth daily. Take first 2 tablets together, then 1 every day until finished. 09/07/23   Corlis Burnard DEL, NP  ENBREL SURECLICK 50 MG/ML injection Inject into the skin.    [provider]   hydroxychloroquine (PLAQUENIL) 200 MG tablet Take 200 mg by mouth 2 (two) times daily.    [provider]  meloxicam (MOBIC) 15 MG tablet Take 15 mg by mouth daily.    [provider]  methocarbamol  (ROBAXIN ) 750 MG tablet Take 1 tablet (750 mg total) by mouth 3 (three) times daily as needed (muscle spasm/pain). 12/28/23   Bernard Drivers, MD  norethindrone  (MICRONOR ,CAMILA ,ERRIN ) 0.35 MG tablet Take 1 tablet by mouth at bedtime.  Patient not taking: Reported on 09/07/2023 01/23/18   [provider]  ondansetron  (ZOFRAN -ODT) 4 MG disintegrating tablet Take 1 tablet (4 mg total) by mouth every 8 (eight) hours as needed for nausea or vomiting. 11/04/22   Tegeler, Lonni PARAS, MD  oxyCODONE -acetaminophen  (PERCOCET/ROXICET) 5-325 MG tablet Take 1 tablet by mouth every 4 (four) hours as needed for severe pain. Patient not taking: Reported on 09/07/2023 11/04/22   Tegeler, Lonni PARAS, MD  predniSONE  (DELTASONE ) 20 MG tablet Take 3 tablets by mouth once a day for 2 days, then 2 tablets by  mouth once a day for 3 days, then 1 tablet by mouth once a day for 3 days 12/29/23   Steinl, Kevin, MD  traMADol  (ULTRAM ) 50 MG tablet Take 1 tablet (50 mg total) by mouth every 6 (six) hours as needed. 12/28/23   Bernard Drivers, MD    Family History Family History  Problem Relation Age of Onset   Heart disease Maternal Grandmother    Cancer Maternal Grandfather    Cancer Paternal Grandfather     Social History Social History   Tobacco Use   Smoking status: Never   Smokeless tobacco: Never  Vaping Use   Vaping status: Never Used  Substance Use Topics   Alcohol use: No    Comment: Occasional   Drug use: No     Allergies   Dicyclomine hcl   Review of Systems Review of Systems  Skin:  Positive for rash.     Physical Exam Triage Vital Signs ED Triage Vitals  Encounter Vitals Group     BP 02/21/24 0925 108/77     Girls Systolic BP Percentile --      Girls Diastolic BP  Percentile --      Boys Systolic BP Percentile --      Boys Diastolic BP Percentile --      Pulse Rate 02/21/24 0925 85     Resp 02/21/24 0925 18     Temp 02/21/24 0925 98.2 F (36.8 C)     Temp Source 02/21/24 0925 Oral     SpO2 02/21/24 0925 97 %     Weight --      Height --      Head Circumference --      Peak Flow --      Pain Score 02/21/24 0937 0     Pain Loc --      Pain Education --      Exclude from Growth Chart --    No data found.  Updated Vital Signs BP 108/77 (BP Location: Left Arm)   Pulse 85   Temp 98.2 F (36.8 C) (Oral)   Resp 18   LMP 02/15/2024 (Approximate)   SpO2 97%   Visual Acuity Right Eye Distance:   Left Eye Distance:   Bilateral Distance:    Right Eye Near:   Left Eye Near:    Bilateral Near:     Physical Exam Constitutional:      Appearance: Normal appearance.   Eyes:     Extraocular Movements: Extraocular movements intact.   Pulmonary:     Effort: Pulmonary effort is normal.   Musculoskeletal:     Comments: Erythematous papular blistering rash present to the face and the trunk of the body   Neurological:     Mental Status: She is alert and oriented to person, place, and time. Mental status is at baseline.      UC Treatments / Results  Labs (all labs ordered are listed, but only abnormal results are displayed) Labs Reviewed - No data to display  EKG   Radiology No results found.  Procedures Procedures (including critical care time)  Medications Ordered in UC Medications  methylPREDNISolone acetate (DEPO-MEDROL) injection 60 mg (has no administration in time range)    Initial Impression / Assessment and Plan / UC Course  I have reviewed the triage vital signs and the nursing notes.  Pertinent labs & imaging results that were available during my care of the patient were reviewed by me and considered in my medical decision making (  see chart for details).  Poison ivy dermatitis  Presentation consistent with  above diagnoses, methylprednisolone IM given, recommended initiating steroid course tomorrow, may continue antihistamines for management of pruritus advised follow-up for nonhealing rash Final Clinical Impressions(s) / UC Diagnoses   Final diagnoses:  Poison ivy dermatitis     Discharge Instructions      Today you are being treated for the poison ivy/oak rash  You have been given an injection of steroids today in the office today to help reduce the inflammatory process that occurs with this rash which will help minimize your itching as well as begin to clear  Starting tomorrow take prednisone  every morning with food as directed, to continue the above process  You may continue use of topical calamine or Benadryl  cream to help manage itching, you may also continue oral Benadryl   Please avoid long exposures to heat such as a hot steamy shower or being outside as this may cause further irritation to your rash  You may follow-up with his urgent care as needed if symptoms persist or worsen    ED Prescriptions   None    PDMP not reviewed this encounter.   Teresa Shelba SAUNDERS, [place] 02/21/24 [phone]

## 2024-02-21 NOTE — ED Triage Notes (Signed)
 Patient reports a possible exposure to poison oak or Ivy on Friday.  Reports  that she had a virtual visit yesterday but was unable to pickup medication because pharmacy was closed. Rash was on neck an face and has spread all over face close to eyes. Denies pain.
